# Patient Record
Sex: Female | Born: 1985 | Race: White | Hispanic: No | Marital: Single | Smoking: Never smoker
Health system: Southern US, Community
[De-identification: ages and names within clinical notes are randomized; demographics above are authoritative.]

## PROBLEM LIST (undated history)

## (undated) DIAGNOSIS — Q631 Lobulated, fused and horseshoe kidney: Secondary | ICD-10-CM

## (undated) DIAGNOSIS — E559 Vitamin D deficiency, unspecified: Secondary | ICD-10-CM

## (undated) DIAGNOSIS — Q632 Ectopic kidney: Secondary | ICD-10-CM

## (undated) DIAGNOSIS — F419 Anxiety disorder, unspecified: Secondary | ICD-10-CM

---

## 2016-01-07 ENCOUNTER — Encounter (HOSPITAL_COMMUNITY): Payer: Self-pay

## 2016-01-07 ENCOUNTER — Emergency Department (HOSPITAL_COMMUNITY)
Admission: EM | Admit: 2016-01-07 | Discharge: 2016-01-07 | Disposition: A | Discharge status: Home / Self Care | Payer: Medicaid Other | Attending: Emergency Medicine | Admitting: Emergency Medicine

## 2016-01-07 ENCOUNTER — Emergency Department (HOSPITAL_COMMUNITY): Payer: Medicaid Other

## 2016-01-07 DIAGNOSIS — M62838 Other muscle spasm: Secondary | ICD-10-CM

## 2016-01-07 DIAGNOSIS — Z791 Long term (current) use of non-steroidal anti-inflammatories (NSAID): Secondary | ICD-10-CM | POA: Diagnosis not present

## 2016-01-07 DIAGNOSIS — M542 Cervicalgia: Secondary | ICD-10-CM

## 2016-01-07 MED ORDER — KETOROLAC TROMETHAMINE 60 MG/2ML IM SOLN
60.0000 mg | Freq: Once | INTRAMUSCULAR | Status: AC
  Administered 2016-01-07: 60 mg via INTRAMUSCULAR
  Filled 2016-01-07: qty 2

## 2016-01-07 MED ORDER — CYCLOBENZAPRINE HCL 10 MG PO TABS: 10.0000 mg | ORAL_TABLET | Freq: Three times a day (TID) | ORAL | Status: AC | PRN

## 2016-01-07 MED ORDER — DIAZEPAM 5 MG PO TABS
5.0000 mg | ORAL_TABLET | Freq: Once | ORAL | Status: AC
  Administered 2016-01-07: 5 mg via ORAL
  Filled 2016-01-07: qty 1

## 2016-01-07 MED ORDER — HYDROCODONE-ACETAMINOPHEN 5-325 MG PO TABS: 1.0000 | ORAL_TABLET | Freq: Four times a day (QID) | ORAL | Status: AC | PRN

## 2016-01-07 MED ORDER — IBUPROFEN 600 MG PO TABS: 600.0000 mg | ORAL_TABLET | Freq: Three times a day (TID) | ORAL | Status: AC | PRN

## 2016-01-07 MED ORDER — OXYCODONE-ACETAMINOPHEN 5-325 MG PO TABS
1.0000 | ORAL_TABLET | Freq: Once | ORAL | Status: AC
  Administered 2016-01-07: 1 via ORAL
  Filled 2016-01-07: qty 1

## 2016-01-07 NOTE — ED Notes (Signed)
MD at bedside. 

## 2016-01-07 NOTE — ED Notes (Signed)
Called for a ride

## 2016-01-07 NOTE — ED Notes (Signed)
Patient states she turned her head this AM and noted that she had posterior neck pain then she became dizzy and felt hot.

## 2016-01-07 NOTE — ED Notes (Signed)
Pt independent and ambulatory at discharge.  

## 2016-01-07 NOTE — Discharge Instructions (Signed)
Cervical Sprain  A cervical sprain is an injury in the neck in which the strong, fibrous tissues (ligaments) that connect your neck bones stretch or tear. Cervical sprains can range from mild to severe. Severe cervical sprains can cause the neck vertebrae to be unstable. This can lead to damage of the spinal cord and can result in serious nervous system problems. The amount of time it takes for a cervical sprain to get better depends on the cause and extent of the injury. Most cervical sprains heal in 1 to 3 weeks.  CAUSES   Severe cervical sprains may be caused by:    Contact sport injuries (such as from football, rugby, wrestling, hockey, auto racing, gymnastics, diving, martial arts, or boxing).    Motor vehicle collisions.    Whiplash injuries. This is an injury from a sudden forward and backward whipping movement of the head and neck.   Falls.   Mild cervical sprains may be caused by:    Being in an awkward position, such as while cradling a telephone between your ear and shoulder.    Sitting in a chair that does not offer proper support.    Working at a poorly designed computer station.    Looking up or down for long periods of time.   SYMPTOMS    Pain, soreness, stiffness, or a burning sensation in the front, back, or sides of the neck. This discomfort may develop immediately after the injury or slowly, 24 hours or more after the injury.    Pain or tenderness directly in the middle of the back of the neck.    Shoulder or upper back pain.    Limited ability to move the neck.    Headache.    Dizziness.    Weakness, numbness, or tingling in the hands or arms.    Muscle spasms.    Difficulty swallowing or chewing.    Tenderness and swelling of the neck.   DIAGNOSIS   Most of the time your health care provider can diagnose a cervical sprain by taking your history and doing a physical exam. Your health care provider will ask about previous neck injuries and any known neck  problems, such as arthritis in the neck. X-rays may be taken to find out if there are any other problems, such as with the bones of the neck. Other tests, such as a CT scan or MRI, may also be needed.   TREATMENT   Treatment depends on the severity of the cervical sprain. Mild sprains can be treated with rest, keeping the neck in place (immobilization), and pain medicines. Severe cervical sprains are immediately immobilized. Further treatment is done to help with pain, muscle spasms, and other symptoms and may include:   Medicines, such as pain relievers, numbing medicines, or muscle relaxants.    Physical therapy. This may involve stretching exercises, strengthening exercises, and posture training. Exercises and improved posture can help stabilize the neck, strengthen muscles, and help stop symptoms from returning.   HOME CARE INSTRUCTIONS    Put ice on the injured area.     Put ice in a plastic bag.     Place a towel between your skin and the bag.     Leave the ice on for 15-20 minutes, 3-4 times a day.    If your injury was severe, you may have been given a cervical collar to wear. A cervical collar is a two-piece collar designed to keep your neck from moving while it heals.      Do not remove the collar unless instructed by your health care provider.    If you have long hair, keep it outside of the collar.    Ask your health care provider before making any adjustments to your collar. Minor adjustments may be required over time to improve comfort and reduce pressure on your chin or on the back of your head.    Ifyou are allowed to remove the collar for cleaning or bathing, follow your health care provider's instructions on how to do so safely.    Keep your collar clean by wiping it with mild soap and water and drying it completely. If the collar you have been given includes removable pads, remove them every 1-2 days and hand wash them with soap and water. Allow them to air dry. They should be completely  dry before you wear them in the collar.    If you are allowed to remove the collar for cleaning and bathing, wash and dry the skin of your neck. Check your skin for irritation or sores. If you see any, tell your health care provider.    Do not drive while wearing the collar.    Only take over-the-counter or prescription medicines for pain, discomfort, or fever as directed by your health care provider.    Keep all follow-up appointments as directed by your health care provider.    Keep all physical therapy appointments as directed by your health care provider.    Make any needed adjustments to your workstation to promote good posture.    Avoid positions and activities that make your symptoms worse.    Warm up and stretch before being active to help prevent problems.   SEEK MEDICAL CARE IF:    Your pain is not controlled with medicine.    You are unable to decrease your pain medicine over time as planned.    Your activity level is not improving as expected.   SEEK IMMEDIATE MEDICAL CARE IF:    You develop any bleeding.   You develop stomach upset.   You have signs of an allergic reaction to your medicine.    Your symptoms get worse.    You develop new, unexplained symptoms.    You have numbness, tingling, weakness, or paralysis in any part of your body.   MAKE SURE YOU:    Understand these instructions.   Will watch your condition.   Will get help right away if you are not doing well or get worse.     This information is not intended to replace advice given to you by your health care provider. Make sure you discuss any questions you have with your health care provider.     Document Released: 05/30/2007 Document Revised: 08/07/2013 Document Reviewed: 02/07/2013  [employer]vier Interactive Patient Education 2016 [employer]

## 2016-01-07 NOTE — ED Notes (Addendum)
Pt states at 0730 turned head to left and felt sharp pain to rt arm with limited rt arm movement lasting only til 0830. Pt states she felt dizzy at moment however at present symptoms are better. Pt neuro intact. Pt denies slurred speech. Pt c/o of posterior pain to neck and shoulders bil

## 2016-01-07 NOTE — ED Provider Notes (Signed)
CSN: [id number]     Arrival date & time 01/07/16  0807 History   First MD Initiated Contact with Patient 01/07/16 [phone]     Chief Complaint  Patient presents with  . Neck Pain      HPI Patient presents to the emergency department with complaints of severe neck pain after leaning her head back this morning.  She was prepared to go to work.  She reports pain with movement of her neck at this time.  She denies weakness of her upper lower extremities.  She had transient dizziness which is since resolved.   History reviewed. No pertinent past medical history. History reviewed. No pertinent past surgical history. Family History  Problem Relation Age of Onset  . Heart failure Mother   . Diabetes Mother   . Cancer Father    Social History  Substance Use Topics  . Smoking status: Never Smoker   . Smokeless tobacco: Never Used  . Alcohol Use: No   OB History    No data available     Review of Systems  All other systems reviewed and are negative.     Allergies  Review of patient's allergies indicates no known allergies.  Home Medications   Prior to Admission medications   Medication Sig Start Date End Date Taking? Authorizing Provider  Biotin 10 MG CAPS Take 1 capsule by mouth daily.   Yes Historical Provider, MD  cholecalciferol (VITAMIN D) 400 units TABS tablet Take 400 Units by mouth daily.   Yes Historical Provider, MD  diphenhydramine-acetaminophen (TYLENOL PM) 25-500 MG TABS tablet Take 1 tablet by mouth at bedtime as needed.   Yes Historical Provider, MD  ferrous sulfate 325 (65 FE) MG tablet Take 325 mg by mouth daily with breakfast.   Yes Historical Provider, MD  medroxyPROGESTERone (DEPO-PROVERA) 150 MG/ML injection Inject 150 mg into the muscle every 3 (three) months. Last shot January. Due for another shot   Yes Historical Provider, MD  Melatonin 1 MG TABS Take 2 tablets by mouth at bedtime.   Yes Historical Provider, MD  vitamin B-12 (CYANOCOBALAMIN) 100 MCG tablet  Take 100 mcg by mouth daily.   Yes Historical Provider, MD  cyclobenzaprine (FLEXERIL) 10 MG tablet Take 1 tablet (10 mg total) by mouth 3 (three) times daily as needed for muscle spasms. 01/07/16   Azalia Bilis, MD  HYDROcodone-acetaminophen (NORCO/VICODIN) 5-325 MG tablet Take 1 tablet by mouth every 6 (six) hours as needed for moderate pain. 01/07/16   Azalia Bilis, MD  ibuprofen (ADVIL,MOTRIN) 600 MG tablet Take 1 tablet (600 mg total) by mouth every 8 (eight) hours as needed. 01/07/16   Azalia Bilis, MD   BP 116/79 mmHg  Pulse 86  Temp(Src) 98.2 F (36.8 C) (Oral)  Resp 18  Ht  (1.575 m)  Wt 130 lb (58.968 kg)  BMI 23.77 kg/m2  SpO2 100%  LMP 01/07/2016 Physical Exam  Constitutional: She is oriented to person, place, and time. She appears well-developed and well-nourished.  HENT:  Head: Normocephalic.  Eyes: EOM are normal.  Neck: Neck supple.  Mild cervical and paracervical tenderness without cervical step-off.  Pulmonary/Chest: Effort normal.  Abdominal: She exhibits no distension.  Musculoskeletal: Normal range of motion.  Neurological: She is alert and oriented to person, place, and time.  5 out of 5 strength in bilateral upper and lower extremity major muscle groups.  Psychiatric: She has a normal mood and affect.  Nursing note and vitals reviewed.   ED Course  Procedures (  including critical care time) Labs Review Labs Reviewed - No data to display  Imaging Review Dg Cervical Spine Complete  01/07/2016  CLINICAL DATA:  Neck pain post bending this morning, right posterior neck stiffness, sharp pain posterior neck EXAM: CERVICAL SPINE - COMPLETE 4+ VIEW COMPARISON:  None. FINDINGS: Six views of the cervical spine submitted. No acute fracture or subluxation. Alignment and vertebral body heights are preserved. Minimal disc space flattening at C5-C6 level. No prevertebral soft tissue swelling. Cervical airway is patent. C1-C2 relationship is unremarkable. No neural  foramina narrowing noted on oblique views. IMPRESSION: No acute fracture or subluxation. Alignment and vertebral body heights are preserved. Minimal disc space flattening at C5-C6 level. Electronically Signed   By: Natasha MeadLiviu  Pop M.D.   On: 01/07/2016 10:21   I have personally reviewed and evaluated these images and lab results as part of my medical decision-making.   EKG Interpretation None      MDM   Final diagnoses:  Neck pain  Neck muscle spasm    11:52 AM Patient feels much better after management of her pain in the emergency department.  Home with short course of anti-inflammatories and muscle relaxants.  Primary care follow-up.  She understands return to the ER for new or worsening symptoms.  Suspect cervical strain/spasm.  Doubt vertebral artery injury    Azalia BilisKevin Atha Mcbain, MD 01/07/16 1152

## 2016-02-14 ENCOUNTER — Encounter (HOSPITAL_COMMUNITY): Payer: Self-pay | Admitting: Emergency Medicine

## 2016-02-14 ENCOUNTER — Emergency Department (HOSPITAL_COMMUNITY): Payer: Medicaid Other

## 2016-02-14 ENCOUNTER — Emergency Department (HOSPITAL_COMMUNITY)
Admission: EM | Admit: 2016-02-14 | Discharge: 2016-02-15 | Disposition: A | Discharge status: Home / Self Care | Payer: Medicaid Other | Attending: Emergency Medicine | Admitting: Emergency Medicine

## 2016-02-14 DIAGNOSIS — Z79899 Other long term (current) drug therapy: Secondary | ICD-10-CM | POA: Diagnosis not present

## 2016-02-14 DIAGNOSIS — R51 Headache: Secondary | ICD-10-CM | POA: Diagnosis not present

## 2016-02-14 DIAGNOSIS — I7774 Dissection of vertebral artery: Secondary | ICD-10-CM

## 2016-02-14 DIAGNOSIS — R200 Anesthesia of skin: Secondary | ICD-10-CM | POA: Diagnosis not present

## 2016-02-14 DIAGNOSIS — M542 Cervicalgia: Secondary | ICD-10-CM | POA: Insufficient documentation

## 2016-02-14 LAB — BASIC METABOLIC PANEL
Anion gap: 7 (ref 5–15)
BUN: 21 mg/dL — ABNORMAL HIGH (ref 6–20)
CO2: 25 mmol/L (ref 22–32)
Calcium: 9.4 mg/dL (ref 8.9–10.3)
Chloride: 106 mmol/L (ref 101–111)
Creatinine, Ser: 0.67 mg/dL (ref 0.44–1.00)
GFR calc Af Amer: >60 mL/min (ref >60)
GFR calc non Af Amer: >60 mL/min (ref >60)
Glucose, Bld: 86 mg/dL (ref 65–99)
Potassium: 3.6 mmol/L (ref 3.5–5.1)
Sodium: 138 mmol/L (ref 135–145)

## 2016-02-14 LAB — CBC WITH DIFFERENTIAL/PLATELET
Basophils Absolute: 0 10*3/uL (ref 0.0–0.1)
Basophils Relative: 0 %
Eosinophils Absolute: 0.1 10*3/uL (ref 0.0–0.7)
Eosinophils Relative: 1 %
HCT: 38.6 % (ref 36.0–46.0)
Hemoglobin: 13.3 g/dL (ref 12.0–15.0)
Lymphocytes Relative: 30 %
Lymphs Abs: 2.8 10*3/uL (ref 0.7–4.0)
MCH: 30.6 pg (ref 26.0–34.0)
MCHC: 34.5 g/dL (ref 30.0–36.0)
MCV: 88.9 fL (ref 78.0–100.0)
Monocytes Absolute: 0.6 10*3/uL (ref 0.1–1.0)
Monocytes Relative: 6 %
Neutro Abs: 5.9 10*3/uL (ref 1.7–7.7)
Neutrophils Relative %: 63 %
Platelets: 189 10*3/uL (ref 150–400)
RBC: 4.34 MIL/uL (ref 3.87–5.11)
RDW: 12.4 % (ref 11.5–15.5)
WBC: 9.4 10*3/uL (ref 4.0–10.5)

## 2016-02-14 LAB — PREGNANCY, URINE: Preg Test, Ur: NEGATIVE

## 2016-02-14 MED ORDER — MORPHINE SULFATE (PF) 4 MG/ML IV SOLN
4.0000 mg | Freq: Once | INTRAVENOUS | Status: AC
  Administered 2016-02-14: 4 mg via INTRAVENOUS
  Filled 2016-02-14: qty 1

## 2016-02-14 NOTE — ED Notes (Signed)
Pt has had neck pain since May with no injury of origin. Pt states she hurts worse when she moves her head back or forwards. Pt has been seen by her PCP and is now seeing a chiropractor. Pt states that her PCP just gives her pain medication and muscle relaxers, but does not give her answers as to why she is hurting. Pt states that the pain has not increased or changed, she is just "tired of it and wants answers".

## 2016-02-14 NOTE — ED Provider Notes (Signed)
CSN: [id number]     Arrival date & time 02/14/16  1907 History  By signing my name below, I, Linna DarnerRussell Turner, attest that this documentation has been prepared under the direction and in the presence of non-physician practitioner, Buel ReamAlexandra Alesha Jaffee, PA-C. Electronically Signed: Linna Darnerussell Turner, Scribe. 02/14/2016. 7:43 PM.   Chief Complaint  Patient presents with  . Neck Pain    The history is provided by the patient. No language interpreter was used.     HPI Comments: Kerry Reid is a 30 y.o. female who presents to the Emergency Department complaining of constant, waxing and waning, sharp, unchanged, left-sided posterior neck pain onset one month. She was seen here when her pain first presented. Pt reports that her pain initially presented when she was doing her hair, lifted an arm, and felt like she tweaked her neck. Pt states that her pain is worse with certain movements and positions; she states that cannot fully rotate her neck to the left due to pain and notes that her most severe neck pain presents when she tries to get up after laying down. She denies pain exacerbation with palpation to her neck. She notes that she has experienced occasional, burning numbness in her neck "a few times" that lasts for a few seconds. She notes new fatigue, weakness, and intermittent generalized headaches over the last several days; she states that she has a frontal headache currently. Pt has seen her PCP and a chiropractor for her neck pain and has been prescribed hydrocodone and muscle relaxants; she states that nothing has helped and she is not using the muscle relaxants anymore. Patient did not see a chiropractor regularly before this neck pain began. She denies known injury to her neck. 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 She denies neuro deficits, neck stiffness, vision changes, fevers, or any other associated symptoms.  History reviewed. No pertinent past medical  history. History reviewed. No pertinent past surgical history. Family History  Problem Relation Age of Onset  . Heart failure Mother   . Diabetes Mother   . Cancer Father    Social History  Substance Use Topics  . Smoking status: Never Smoker   . Smokeless tobacco: Never Used  . Alcohol Use: No   OB History    No data available     Review of Systems  Constitutional: Positive for fatigue. Negative for fever and chills.  HENT: Negative for facial swelling and sore throat.   Respiratory: Negative for shortness of breath.   Cardiovascular: Negative for chest pain.  Gastrointestinal: Negative for nausea, vomiting and abdominal pain.  Genitourinary: Negative for dysuria.  Musculoskeletal: Positive for neck pain (left-sided posterior) and neck stiffness. Negative for back pain.  Skin: Negative for rash and wound.  Neurological: Positive for numbness (intermittent to L neck) and headaches.  Psychiatric/Behavioral: The patient is not nervous/anxious.     Allergies  Review of patient's allergies indicates no known allergies.  Home Medications   Prior to Admission medications   Medication Sig Start Date End Date Taking? Authorizing Provider  Biotin 10 MG CAPS Take 1 capsule by mouth daily.   Yes Historical Provider, MD  cholecalciferol (VITAMIN D) 400 units TABS tablet Take 400 Units by mouth daily.   Yes Historical Provider, MD  ferrous sulfate 325 (65 FE) MG tablet Take 325 mg by mouth daily with breakfast.   Yes Historical Provider, MD  medroxyPROGESTERone (DEPO-PROVERA) 150 MG/ML injection Inject 150 mg into the muscle every  3 (three) months. Last shot January. Due for another shot   Yes Historical Provider, MD  Melatonin 1 MG TABS Take 5 mg by mouth at bedtime.    Yes Historical Provider, MD  vitamin B-12 (CYANOCOBALAMIN) 100 MCG tablet Take 100 mcg by mouth daily.   Yes Historical Provider, MD  cyclobenzaprine (FLEXERIL) 10 MG tablet Take 1 tablet (10 mg total) by mouth 3  (three) times daily as needed for muscle spasms. Patient not taking: Reported on 02/14/2016 01/07/16   Azalia Bilis, MD  HYDROcodone-acetaminophen (NORCO/VICODIN) 5-325 MG tablet Take 1 tablet by mouth every 6 (six) hours as needed for moderate pain. Patient not taking: Reported on 02/14/2016 01/07/16   Azalia Bilis, MD  ibuprofen (ADVIL,MOTRIN) 600 MG tablet Take 1 tablet (600 mg total) by mouth every 8 (eight) hours as needed. Patient not taking: Reported on 02/14/2016 01/07/16   Azalia Bilis, MD   BP 128/77 mmHg  Pulse 76  Temp(Src) 98.7 F (37.1 C) (Oral)  Resp 16  Ht  (1.575 m)  Wt 58.968 kg  BMI 23.77 kg/m2  SpO2 100% Physical Exam  Constitutional: She appears well-developed and well-nourished. No distress.  HENT:  Head: Normocephalic and atraumatic.  Mouth/Throat: Oropharynx is clear and moist. No oropharyngeal exudate.  Eyes: Conjunctivae and EOM are normal. Pupils are equal, round, and reactive to light. Right eye exhibits no discharge. Left eye exhibits no discharge. No scleral icterus.  Neck: Neck supple. No thyromegaly present.  No tenderness on palpation of lateral neck musculature or cervical spine, no masses palpated; patient has limited range of motion due to pain  Cardiovascular: Normal rate, regular rhythm, normal heart sounds and intact distal pulses.  Exam reveals no gallop and no friction rub.   No murmur heard. Pulmonary/Chest: Effort normal and breath sounds normal. No stridor. No respiratory distress. She has no wheezes. She has no rales.  Abdominal: Soft. Bowel sounds are normal. She exhibits no distension. There is no tenderness. There is no rebound and no guarding.  Musculoskeletal: She exhibits no edema.  Lymphadenopathy:    She has no cervical adenopathy.  Neurological: She is alert. Coordination normal.  CN 3-12 intact; normal sensation throughout; 5/5 strength in all 4 extremities; equal bilateral grip strength  Skin: Skin is warm and dry. No rash noted.  She is not diaphoretic. No pallor.  Psychiatric: She has a normal mood and affect.  Nursing note and vitals reviewed.   ED Course  Procedures (including critical care time)  DIAGNOSTIC STUDIES: Oxygen Saturation is 100% on RA, normal by my interpretation.    COORDINATION OF CARE: 7:43 PM Discussed treatment plan with pt at bedside and pt agreed to plan.  Labs Review Labs Reviewed  BASIC METABOLIC PANEL - Abnormal; Notable for the following:    BUN 21 (*)    All other components within normal limits  CBC WITH DIFFERENTIAL/PLATELET  PREGNANCY, URINE    Imaging Review No results found. I have personally reviewed and evaluated these images and lab results as part of my medical decision-making.   EKG Interpretation None      MDM   CBC, BMP unremarkable. Urine pregnancy negative. Concern for vertebral artery dissection, concern for MS or other neurological etiology. MRI, MRA pending. Patient also evaluated by Dr. Verdie Mosher who is in agreement with plan. At shift change, patient care transferred to [facility], PA-C for continued evaluation, follow up of MRI/ MRA and determination of disposition.     Final diagnoses:  Neck pain  I personally performed the services described in this documentation, which was scribed in my presence. The recorded information has been reviewed and is accurate.   Emi Holeslexandra M Fadumo Heng, PA-C 02/14/16 2226  Lavera Guiseana Duo Liu, MD 02/14/16 2240

## 2016-02-15 MED ORDER — GADOBENATE DIMEGLUMINE 529 MG/ML IV SOLN
13.0000 mL | Freq: Once | INTRAVENOUS | Status: AC | PRN
  Administered 2016-02-15: 13 mL via INTRAVENOUS

## 2016-02-15 NOTE — ED Notes (Signed)
Pt returned from MRI °

## 2016-02-15 NOTE — Discharge Instructions (Signed)
Cervical Sprain  A cervical sprain is an injury in the neck in which the strong, fibrous tissues (ligaments) that connect your neck bones stretch or tear. Cervical sprains can range from mild to severe. Severe cervical sprains can cause the neck vertebrae to be unstable. This can lead to damage of the spinal cord and can result in serious nervous system problems. The amount of time it takes for a cervical sprain to get better depends on the cause and extent of the injury. Most cervical sprains heal in 1 to 3 weeks.  CAUSES   Severe cervical sprains may be caused by:    Contact sport injuries (such as from football, rugby, wrestling, hockey, auto racing, gymnastics, diving, martial arts, or boxing).    Motor vehicle collisions.    Whiplash injuries. This is an injury from a sudden forward and backward whipping movement of the head and neck.   Falls.   Mild cervical sprains may be caused by:    Being in an awkward position, such as while cradling a telephone between your ear and shoulder.    Sitting in a chair that does not offer proper support.    Working at a poorly designed computer station.    Looking up or down for long periods of time.   SYMPTOMS    Pain, soreness, stiffness, or a burning sensation in the front, back, or sides of the neck. This discomfort may develop immediately after the injury or slowly, 24 hours or more after the injury.    Pain or tenderness directly in the middle of the back of the neck.    Shoulder or upper back pain.    Limited ability to move the neck.    Headache.    Dizziness.    Weakness, numbness, or tingling in the hands or arms.    Muscle spasms.    Difficulty swallowing or chewing.    Tenderness and swelling of the neck.   DIAGNOSIS   Most of the time your health care provider can diagnose a cervical sprain by taking your history and doing a physical exam. Your health care provider will ask about previous neck injuries and any known neck  problems, such as arthritis in the neck. X-rays may be taken to find out if there are any other problems, such as with the bones of the neck. Other tests, such as a CT scan or MRI, may also be needed.   TREATMENT   Treatment depends on the severity of the cervical sprain. Mild sprains can be treated with rest, keeping the neck in place (immobilization), and pain medicines. Severe cervical sprains are immediately immobilized. Further treatment is done to help with pain, muscle spasms, and other symptoms and may include:   Medicines, such as pain relievers, numbing medicines, or muscle relaxants.    Physical therapy. This may involve stretching exercises, strengthening exercises, and posture training. Exercises and improved posture can help stabilize the neck, strengthen muscles, and help stop symptoms from returning.   HOME CARE INSTRUCTIONS    Put ice on the injured area.     Put ice in a plastic bag.     Place a towel between your skin and the bag.     Leave the ice on for 15-20 minutes, 3-4 times a day.    If your injury was severe, you may have been given a cervical collar to wear. A cervical collar is a two-piece collar designed to keep your neck from moving while it heals.      Do not remove the collar unless instructed by your health care provider.    If you have long hair, keep it outside of the collar.    Ask your health care provider before making any adjustments to your collar. Minor adjustments may be required over time to improve comfort and reduce pressure on your chin or on the back of your head.    Ifyou are allowed to remove the collar for cleaning or bathing, follow your health care provider's instructions on how to do so safely.    Keep your collar clean by wiping it with mild soap and water and drying it completely. If the collar you have been given includes removable pads, remove them every 1-2 days and hand wash them with soap and water. Allow them to air dry. They should be completely  dry before you wear them in the collar.    If you are allowed to remove the collar for cleaning and bathing, wash and dry the skin of your neck. Check your skin for irritation or sores. If you see any, tell your health care provider.    Do not drive while wearing the collar.    Only take over-the-counter or prescription medicines for pain, discomfort, or fever as directed by your health care provider.    Keep all follow-up appointments as directed by your health care provider.    Keep all physical therapy appointments as directed by your health care provider.    Make any needed adjustments to your workstation to promote good posture.    Avoid positions and activities that make your symptoms worse.    Warm up and stretch before being active to help prevent problems.   SEEK MEDICAL CARE IF:    Your pain is not controlled with medicine.    You are unable to decrease your pain medicine over time as planned.    Your activity level is not improving as expected.   SEEK IMMEDIATE MEDICAL CARE IF:    You develop any bleeding.   You develop stomach upset.   You have signs of an allergic reaction to your medicine.    Your symptoms get worse.    You develop new, unexplained symptoms.    You have numbness, tingling, weakness, or paralysis in any part of your body.   MAKE SURE YOU:    Understand these instructions.   Will watch your condition.   Will get help right away if you are not doing well or get worse.     This information is not intended to replace advice given to you by your health care provider. Make sure you discuss any questions you have with your health care provider.     Document Released: 05/30/2007 Document Revised: 08/07/2013 Document Reviewed: 02/07/2013  [employer]vier Interactive Patient Education 2016 [employer]

## 2016-02-15 NOTE — ED Provider Notes (Signed)
1:41 AM Patient care assumed from Kerry BayleyAlex Law, PA-C. MRA pending at shift change. Imaging has been reviewed and is negative for acute findings. These have been reviewed with the patient who verbalizes understanding. Discussed high suspicion for MSK etiology +/- peripheral radiculopathy. Will d/c with orthopedic referral. Return precautions given at discharge.   Results for orders placed or performed during the hospital encounter of 02/14/16  Basic metabolic panel  Result Value Ref Range   Sodium 138 135 - 145 mmol/L   Potassium 3.6 3.5 - 5.1 mmol/L   Chloride 106 101 - 111 mmol/L   CO2 25 22 - 32 mmol/L   Glucose, Bld 86 65 - 99 mg/dL   BUN 21 (H) 6 - 20 mg/dL   Creatinine, Ser 9.600.67 0.44 - 1.00 mg/dL   Calcium 9.4 8.9 - 45.410.3 mg/dL   GFR calc non Af Amer >60 >60 mL/min   GFR calc Af Amer >60 >60 mL/min   Anion gap 7 5 - 15  CBC with Differential  Result Value Ref Range   WBC 9.4 4.0 - 10.5 K/uL   RBC 4.34 3.87 - 5.11 MIL/uL   Hemoglobin 13.3 12.0 - 15.0 g/dL   HCT 09.838.6 11.936.0 - 14.746.0 %   MCV 88.9 78.0 - 100.0 fL   MCH 30.6 26.0 - 34.0 pg   MCHC 34.5 30.0 - 36.0 g/dL   RDW 82.912.4 56.211.5 - 13.015.5 %   Platelets 189 150 - 400 K/uL   Neutrophils Relative % 63 %   Neutro Abs 5.9 1.7 - 7.7 K/uL   Lymphocytes Relative 30 %   Lymphs Abs 2.8 0.7 - 4.0 K/uL   Monocytes Relative 6 %   Monocytes Absolute 0.6 0.1 - 1.0 K/uL   Eosinophils Relative 1 %   Eosinophils Absolute 0.1 0.0 - 0.7 K/uL   Basophils Relative 0 %   Basophils Absolute 0.0 0.0 - 0.1 K/uL  Pregnancy, urine  Result Value Ref Range   Preg Test, Ur NEGATIVE NEGATIVE   Mr Kerry GlennMra Head Wo Contrast  02/15/2016  CLINICAL DATA:  Initial evaluation for 1 month history of left-sided neck pain. EXAM: MRA NECK WITHOUT AND WITH CONTRAST MRA HEAD WITHOUT CONTRAST TECHNIQUE: Multiplanar and multiecho pulse sequences of the neck were obtained without and with intravenous contrast. Angiographic images of the neck were obtained using MRA technique without  and with intravenous contast.; Angiographic images of the Circle of Willis were obtained using MRA technique without intravenous contrast. CONTRAST:  13mL MULTIHANCE GADOBENATE DIMEGLUMINE 529 MG/ML IV SOLN COMPARISON:  None. FINDINGS: MRA NECK FINDINGS Visualized aortic arch of normal caliber with normal branch pattern. No high-grade stenosis seen at the origin of the great vessels. Visualized subclavian arteries are widely patent. Right common carotid artery patent from its origin to the bifurcation. No significant atheromatous plaque about the right bifurcation. Right ICA patent from the bifurcation to the circle of Willis without evidence for stenosis, dissection, or occlusion. Left common carotid artery patent from its origin to the bifurcation. No significant atheromatous plaque about the left bifurcation. Left ICA patent from the bifurcation to the circle of Willis without evidence for stenosis, dissection, or occlusion. Both of the vertebral arteries arise from the subclavian arteries. Vertebral arteries widely patent without evidence for stenosis, occlusion, or dissection. Visualized soft tissues of the neck grossly unremarkable without acute abnormality. MRA HEAD FINDINGS ANTERIOR CIRCULATION: Visualized distal cervical segments of the internal carotid arteries are patent with antegrade flow. Petrous, cavernous, and supraclinoid segments are widely patent.  A1 segments, anterior communicating artery common anterior cerebral arteries well opacified. M1 segments patent without stenosis or occlusion. MCA bifurcations normal. Distal MCA branches well opacified and symmetric. POSTERIOR CIRCULATION: Vertebral arteries patent to the vertebrobasilar junction. Left posterior inferior cerebral artery patent. Right posterior inferior cerebral artery not well visualized on this exam. Basilar artery widely patent. Superior cerebral arteries patent bilaterally. Both of the posterior cerebral arteries arise from the  basilar artery are well opacified to their distal aspects. Possible mild atheromatous regularity within the mid left P2 segment. Small right posterior communicating artery present. No aneurysm or vascular malformation. IMPRESSION: Normal MRA of the head and neck. Electronically Signed   By: Rise MuBenjamin  McClintock M.D.   On: 02/15/2016 01:29   Mr Angiogram Neck W Wo Contrast  02/15/2016  CLINICAL DATA:  Initial evaluation for 1 month history of left-sided neck pain. EXAM: MRA NECK WITHOUT AND WITH CONTRAST MRA HEAD WITHOUT CONTRAST TECHNIQUE: Multiplanar and multiecho pulse sequences of the neck were obtained without and with intravenous contrast. Angiographic images of the neck were obtained using MRA technique without and with intravenous contast.; Angiographic images of the Circle of Willis were obtained using MRA technique without intravenous contrast. CONTRAST:  13mL MULTIHANCE GADOBENATE DIMEGLUMINE 529 MG/ML IV SOLN COMPARISON:  None. FINDINGS: MRA NECK FINDINGS Visualized aortic arch of normal caliber with normal branch pattern. No high-grade stenosis seen at the origin of the great vessels. Visualized subclavian arteries are widely patent. Right common carotid artery patent from its origin to the bifurcation. No significant atheromatous plaque about the right bifurcation. Right ICA patent from the bifurcation to the circle of Willis without evidence for stenosis, dissection, or occlusion. Left common carotid artery patent from its origin to the bifurcation. No significant atheromatous plaque about the left bifurcation. Left ICA patent from the bifurcation to the circle of Willis without evidence for stenosis, dissection, or occlusion. Both of the vertebral arteries arise from the subclavian arteries. Vertebral arteries widely patent without evidence for stenosis, occlusion, or dissection. Visualized soft tissues of the neck grossly unremarkable without acute abnormality. MRA HEAD FINDINGS ANTERIOR  CIRCULATION: Visualized distal cervical segments of the internal carotid arteries are patent with antegrade flow. Petrous, cavernous, and supraclinoid segments are widely patent. A1 segments, anterior communicating artery common anterior cerebral arteries well opacified. M1 segments patent without stenosis or occlusion. MCA bifurcations normal. Distal MCA branches well opacified and symmetric. POSTERIOR CIRCULATION: Vertebral arteries patent to the vertebrobasilar junction. Left posterior inferior cerebral artery patent. Right posterior inferior cerebral artery not well visualized on this exam. Basilar artery widely patent. Superior cerebral arteries patent bilaterally. Both of the posterior cerebral arteries arise from the basilar artery are well opacified to their distal aspects. Possible mild atheromatous regularity within the mid left P2 segment. Small right posterior communicating artery present. No aneurysm or vascular malformation. IMPRESSION: Normal MRA of the head and neck. Electronically Signed   By: Rise MuBenjamin  McClintock M.D.   On: 02/15/2016 01:29      Antony MaduraKelly Vee Bahe, PA-C 02/15/16 0142  Antony MaduraKelly Delyla Sandeen, PA-C 02/15/16 [id number]  Lavera Guiseana Duo Liu, MD 02/15/16 1201

## 2016-02-15 NOTE — ED Notes (Signed)
Pt transported to MRI 

## 2016-05-31 ENCOUNTER — Encounter (HOSPITAL_COMMUNITY): Payer: Self-pay | Admitting: Emergency Medicine

## 2016-05-31 ENCOUNTER — Emergency Department (HOSPITAL_COMMUNITY)
Admission: EM | Admit: 2016-05-31 | Discharge: 2016-05-31 | Disposition: A | Discharge status: Home / Self Care | Payer: Medicaid Other | Attending: Dermatology | Admitting: Dermatology

## 2016-05-31 DIAGNOSIS — N390 Urinary tract infection, site not specified: Secondary | ICD-10-CM | POA: Insufficient documentation

## 2016-05-31 DIAGNOSIS — Z5321 Procedure and treatment not carried out due to patient leaving prior to being seen by health care provider: Secondary | ICD-10-CM | POA: Diagnosis not present

## 2016-05-31 HISTORY — DX: Ectopic kidney: Q63.2

## 2016-05-31 HISTORY — DX: Anxiety disorder, unspecified: F41.9

## 2016-05-31 HISTORY — DX: Vitamin D deficiency, unspecified: E55.9

## 2016-05-31 NOTE — ED Triage Notes (Signed)
Pt states she has had R sided flank pain with dysuria x 1 day but has recurrent UTIs. Alert and oriented.

## 2016-11-16 IMAGING — MR MR MRA HEAD W/O CM
2 series · 15 of 48 positions shown · IV contrast (multihance)
Comparison: None.

CLINICAL DATA: Initial evaluation for 1 month history of left-sided
neck pain.

EXAM:
MRA NECK WITHOUT AND WITH CONTRAST
MRA HEAD WITHOUT CONTRAST
TECHNIQUE: Multiplanar and multiecho pulse sequences of the neck were obtained
without and with intravenous contrast. Angiographic images of the
neck were obtained using MRA technique without and with intravenous
contast.; Angiographic images of the Circle of Willis were obtained
using MRA technique without intravenous contrast.
CONTRAST:  13mL MULTIHANCE GADOBENATE DIMEGLUMINE 529 MG/ML IV SOLN

[Series 3: (id) mt fs · axial · 1.4mm · 0.39mm/px · z∈[-62,+81]mm · 14 of 215 slices shown]
[im 1/215]
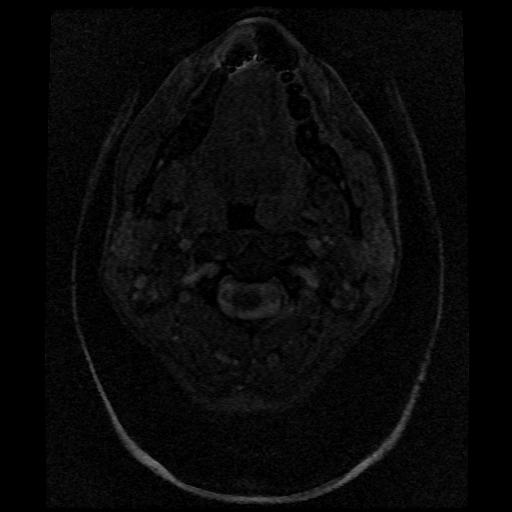
[im 5/215]
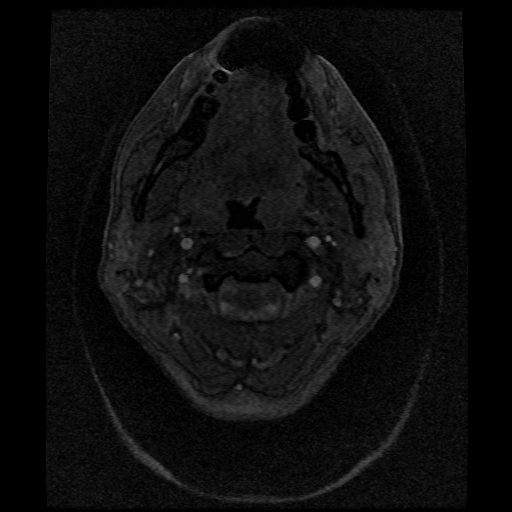
[im 10/215]
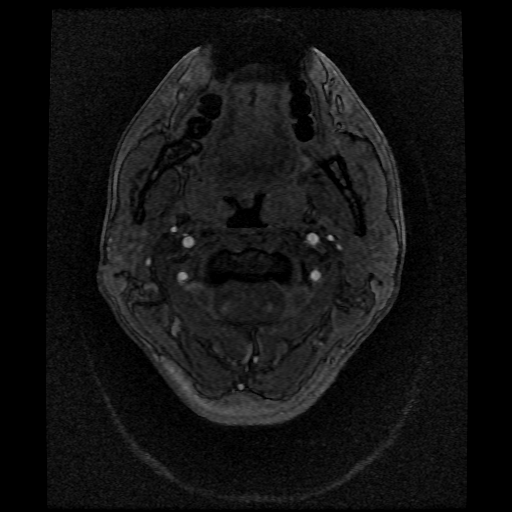
[im 14/215]
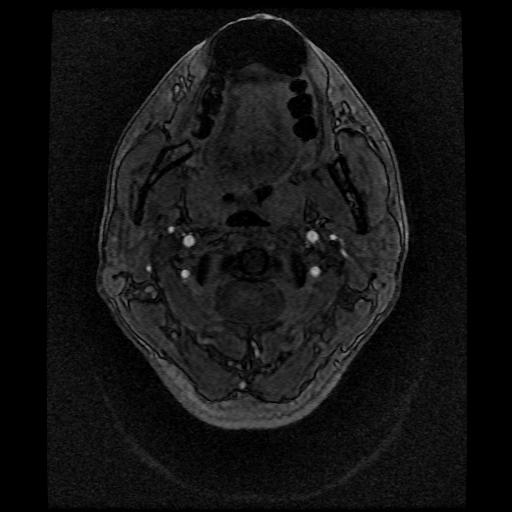
[im 33/215]
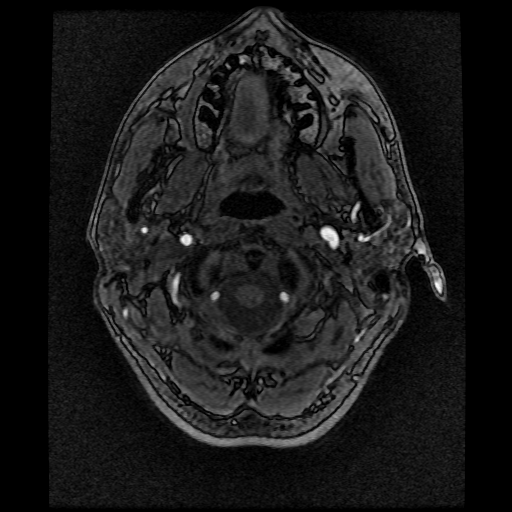
[im 38/215]
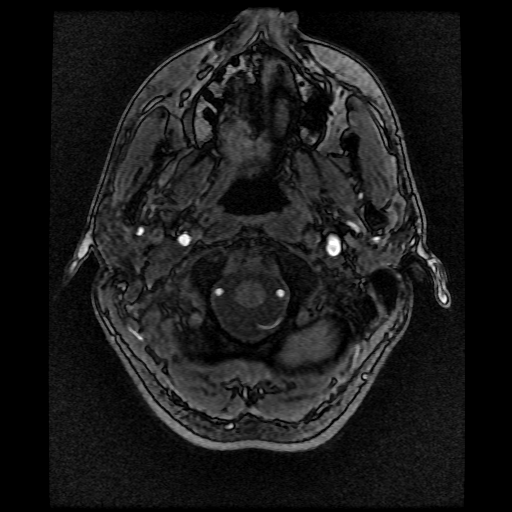
[im 66/215]
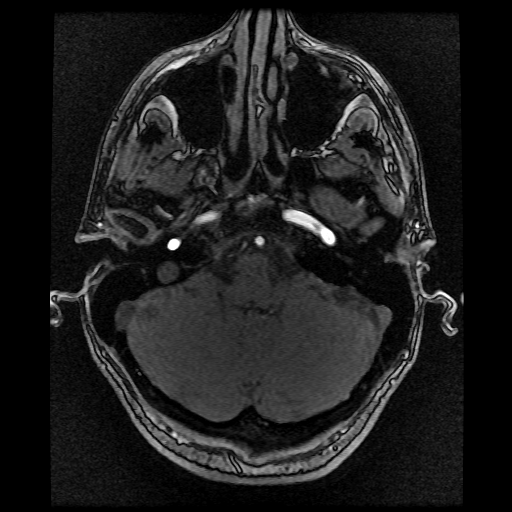
[im 94/215]
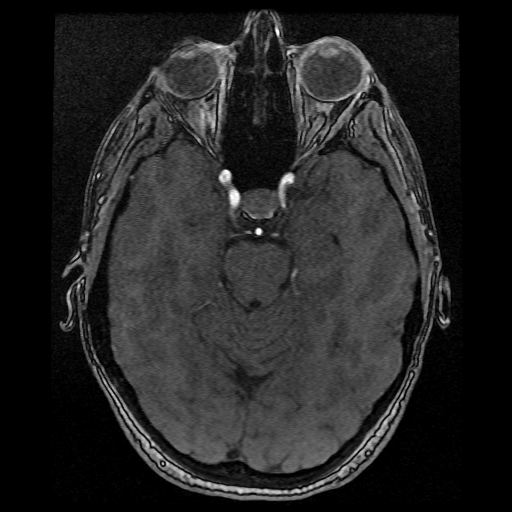
[im 108/215]
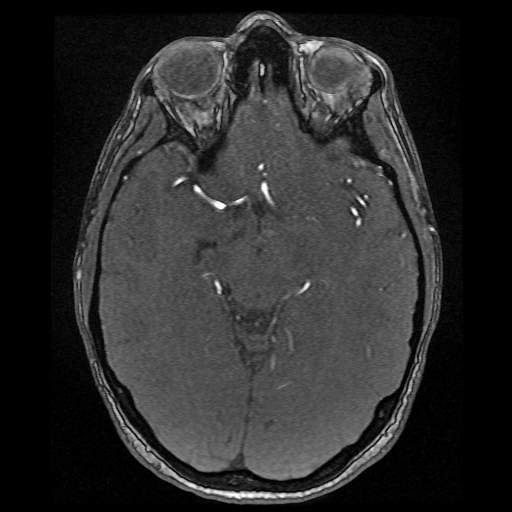
[im 121/215]
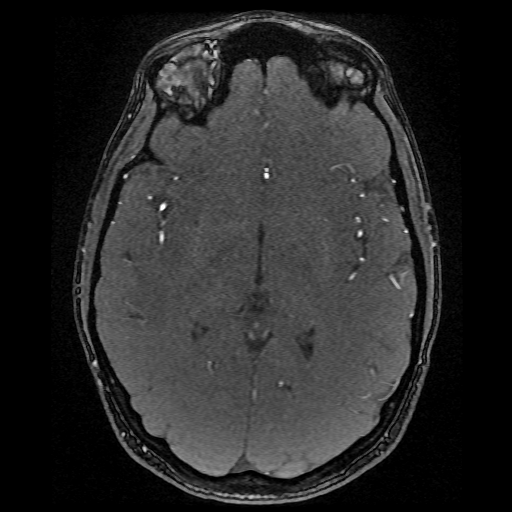
[im 149/215]
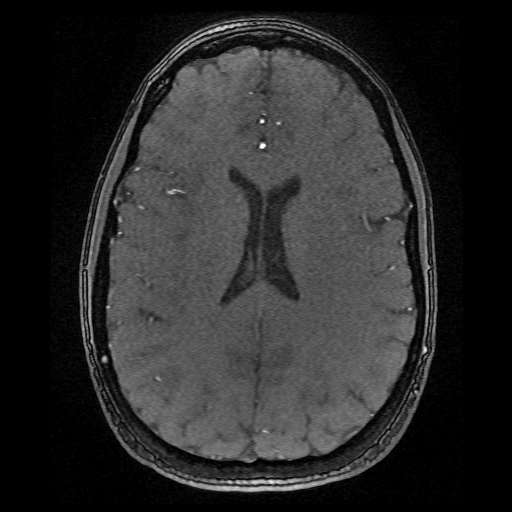
[im 177/215]
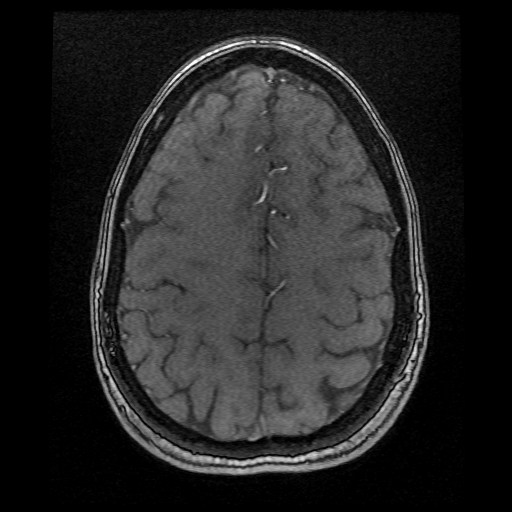
[im 182/215]
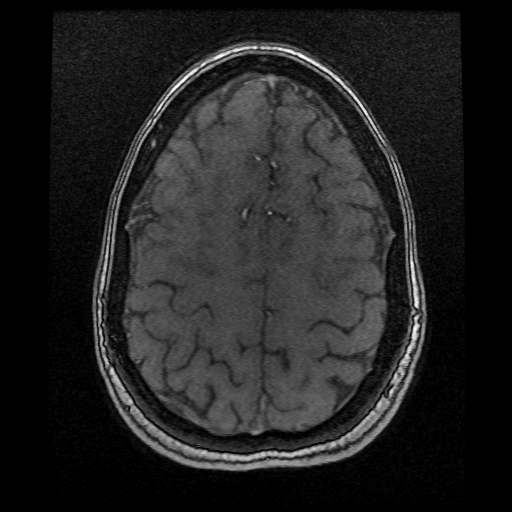
[im 205/215]
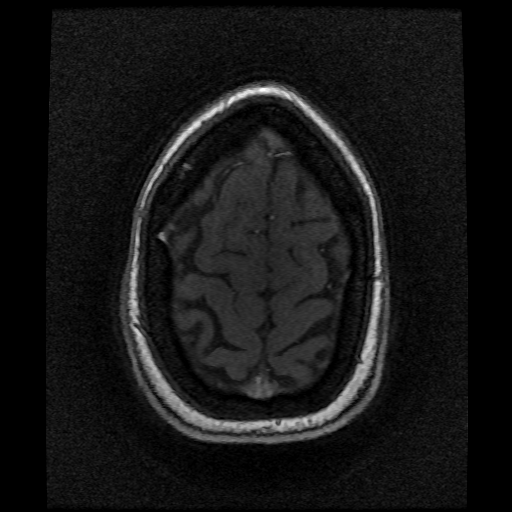

[Series 304: pjn · axial · 1.4mm · 0.20mm/px · 1 of 1 slices shown]
[im 1/1]
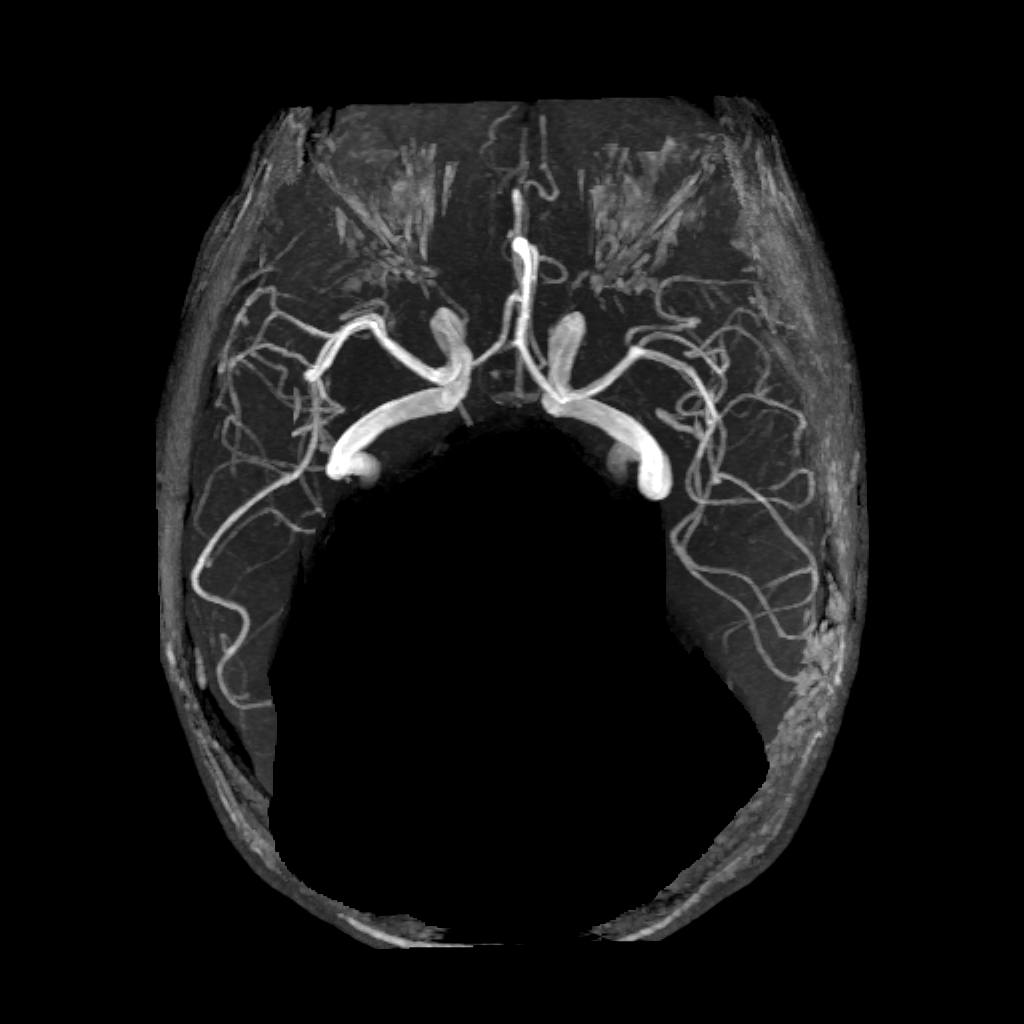

[15 of 48 positions shown; findings below may reference images not displayed]

FINDINGS: MRA NECK FINDINGS

Visualized aortic arch of normal caliber with normal branch pattern.
No high-grade stenosis seen at the origin of the great vessels.
Visualized subclavian arteries are widely patent.

Right common carotid artery patent from its origin to the
bifurcation. No significant atheromatous plaque about the right
bifurcation. Right ICA patent from the bifurcation to the circle of
Willis without evidence for stenosis, dissection, or occlusion.

Left common carotid artery patent from its origin to the
bifurcation. No significant atheromatous plaque about the left
bifurcation. Left ICA patent from the bifurcation to the circle of
Willis without evidence for stenosis, dissection, or occlusion.

Both of the vertebral arteries arise from the subclavian arteries.
Vertebral arteries widely patent without evidence for stenosis,
occlusion, or dissection.

Visualized soft tissues of the neck grossly unremarkable without
acute abnormality.

MRA HEAD FINDINGS

ANTERIOR CIRCULATION:

Visualized distal cervical segments of the internal carotid arteries
are patent with antegrade flow. Petrous, cavernous, and supraclinoid
segments are widely patent. A1 segments, anterior communicating
artery common anterior cerebral arteries well opacified.

M1 segments patent without stenosis or occlusion. MCA bifurcations
normal. Distal MCA branches well opacified and symmetric.

POSTERIOR CIRCULATION:

Vertebral arteries patent to the vertebrobasilar junction. Left
posterior inferior cerebral artery patent. Right posterior inferior
cerebral artery not well visualized on this exam. Basilar artery
widely patent. Superior cerebral arteries patent bilaterally. Both
of the posterior cerebral arteries arise from the basilar artery are
well opacified to their distal aspects. Possible mild atheromatous
regularity within the mid left P2 segment. Small right posterior
communicating artery present.

No aneurysm or vascular malformation.
IMPRESSION: Normal MRA of the head and neck.

## 2019-05-06 ENCOUNTER — Emergency Department
Admission: EM | Admit: 2019-05-06 | Discharge: 2019-05-06 | Disposition: A | Discharge status: Home / Self Care | Payer: Medicaid Other | Source: Ambulatory Visit | Attending: Emergency Medicine | Admitting: Emergency Medicine

## 2019-05-06 ENCOUNTER — Emergency Department: Payer: Medicaid Other

## 2019-05-06 DIAGNOSIS — R1032 Left lower quadrant pain: Secondary | ICD-10-CM | POA: Insufficient documentation

## 2019-05-06 DIAGNOSIS — Q631 Lobulated, fused and horseshoe kidney: Secondary | ICD-10-CM

## 2019-05-06 DIAGNOSIS — N200 Calculus of kidney: Secondary | ICD-10-CM

## 2019-05-06 DIAGNOSIS — N1330 Unspecified hydronephrosis: Secondary | ICD-10-CM

## 2019-05-06 DIAGNOSIS — N210 Calculus in bladder: Secondary | ICD-10-CM

## 2019-05-06 DIAGNOSIS — N23 Unspecified renal colic: Secondary | ICD-10-CM

## 2019-05-06 HISTORY — DX: Lobulated, fused and horseshoe kidney: Q63.1

## 2019-05-06 LAB — URINE MICROSCOPIC (IQ200)
RBC,UA: >50 /hpf — AB (ref 0–3)
WBC,UA: NONE SEEN /hpf (ref 0–5)

## 2019-05-06 LAB — URINALYSIS REFLEX TO CULTURE
Glucose,UA: NEGATIVE mg/dL
Ketones, UA: NEGATIVE mg/dL
Leuk Esterase,UA: NEGATIVE
Nitrite,UA: NEGATIVE
Specific Gravity,UA: 1.036 — ABNORMAL HIGH (ref 1.002–1.030)
pH,UA: 7.5 (ref 5.0–8.0)

## 2019-05-06 LAB — COMPREHENSIVE METABOLIC PANEL
ALT: 16 U/L (ref 0–35)
AST: 24 U/L (ref 0–35)
Albumin: 4.3 g/dL (ref 3.5–5.2)
Alk Phos: 58 U/L (ref 35–105)
Anion Gap: 13 (ref 7–16)
Bilirubin,Total: 0.3 mg/dL (ref 0.0–1.2)
CO2: 22 mmol/L (ref 20–28)
Calcium: 9.6 mg/dL (ref 8.8–10.2)
Chloride: 104 mmol/L (ref 96–108)
Creatinine: 0.9 mg/dL (ref 0.51–0.95)
GFR,Black: 96 *
GFR,Caucasian: 83 *
Glucose: 104 mg/dL — ABNORMAL HIGH (ref 60–99)
Lab: 14 mg/dL (ref 6–20)
Potassium: 3.8 mmol/L (ref 3.4–4.7)
Sodium: 139 mmol/L (ref 133–145)
Total Protein: 7.3 g/dL (ref 6.3–7.7)

## 2019-05-06 LAB — CBC AND DIFFERENTIAL
Baso # K/uL: 0 10*3/uL (ref 0.0–0.1)
Basophil %: 0.3 %
Eos # K/uL: 0.1 10*3/uL (ref 0.0–0.4)
Eosinophil %: 1.5 %
Hematocrit: 40 % (ref 34–45)
Hemoglobin: 13.5 g/dL (ref 11.2–15.7)
IMM Granulocytes #: 0 10*3/uL (ref 0.0–0.0)
IMM Granulocytes: 0.3 %
Lymph # K/uL: 2.6 10*3/uL (ref 1.2–3.7)
Lymphocyte %: 38.1 %
MCH: 31 pg/cell (ref 26–32)
MCHC: 34 g/dL (ref 32–36)
MCV: 91 fL (ref 79–95)
Mono # K/uL: 0.6 10*3/uL (ref 0.2–0.9)
Monocyte %: 8.6 %
Neut # K/uL: 3.5 10*3/uL (ref 1.6–6.1)
Nucl RBC # K/uL: 0 10*3/uL (ref 0.0–0.0)
Nucl RBC %: 0 /100 WBC (ref 0.0–0.2)
Platelets: 227 10*3/uL (ref 160–370)
RBC: 4.4 MIL/uL (ref 3.9–5.2)
RDW: 12.9 % (ref 11.7–14.4)
Seg Neut %: 51.2 %
WBC: 6.9 10*3/uL (ref 4.0–10.0)

## 2019-05-06 LAB — CRP: CRP: <1 mg/L (ref 0–10)

## 2019-05-06 LAB — LIPASE: Lipase: 30 U/L (ref 13–60)

## 2019-05-06 LAB — PREGNANCY TEST, SERUM: Preg,Serum: NEGATIVE

## 2019-05-06 MED ORDER — ONDANSETRON HCL 2 MG/ML IV SOLN *I*
INTRAMUSCULAR | Status: AC
Start: 2019-05-06 — End: 2019-05-06
  Administered 2019-05-06: 4 mg
  Filled 2019-05-06: qty 2

## 2019-05-06 MED ORDER — CYCLOBENZAPRINE HCL 5 MG PO TABS *I*
5.0000 mg | ORAL_TABLET | Freq: Three times a day (TID) | ORAL | 0 refills | Status: AC | PRN
Start: 2019-05-06 — End: 2019-06-05

## 2019-05-06 MED ORDER — SODIUM CHLORIDE 0.9 % IV BOLUS *I*
1000.0000 mL | Freq: Once | Status: AC
Start: 2019-05-06 — End: 2019-05-06
  Administered 2019-05-06: 1000 mL via INTRAVENOUS

## 2019-05-06 MED ORDER — KETOROLAC TROMETHAMINE 30 MG/ML IJ SOLN *I*
30.0000 mg | Freq: Once | INTRAMUSCULAR | Status: AC
Start: 2019-05-06 — End: 2019-05-06

## 2019-05-06 MED ORDER — HYDROCODONE-ACETAMINOPHEN 5-325 MG PO TABS *I*
2.0000 | ORAL_TABLET | Freq: Four times a day (QID) | ORAL | 0 refills | Status: AC | PRN
Start: 2019-05-06 — End: 2019-05-09

## 2019-05-06 MED ORDER — TAMSULOSIN HCL 0.4 MG PO CAPS *I*
0.4000 mg | ORAL_CAPSULE | Freq: Every day | ORAL | 0 refills | Status: DC
Start: 2019-05-06 — End: 2020-04-20

## 2019-05-06 MED ORDER — IOHEXOL 350 MG/ML (OMNIPAQUE) IV SOLN 500ML BOTTLE *I*
1.0000 mL | Freq: Once | INTRAVENOUS | Status: AC
Start: 2019-05-06 — End: 2019-05-06
  Administered 2019-05-06: 95 mL via INTRAVENOUS

## 2019-05-06 MED ORDER — HYDROCODONE-ACETAMINOPHEN 5-325 MG PO TABS *I*
1.0000 | ORAL_TABLET | Freq: Once | ORAL | Status: DC
Start: 2019-05-06 — End: 2019-05-06

## 2019-05-06 MED ORDER — KETOROLAC TROMETHAMINE 30 MG/ML IJ SOLN *I*
INTRAMUSCULAR | Status: AC
Start: 2019-05-06 — End: 2019-05-06
  Administered 2019-05-06: 30 mg via INTRAVENOUS
  Filled 2019-05-06: qty 1

## 2019-05-06 NOTE — ED Triage Notes (Signed)
Pt presents to the ED today for c/o right flank pain that radiates to her abd. Pt states that her pain started a few days ago and has progressively become worse. She denies fever and has no urinaey complaints. Pt appears  uncomfortable but, in NAD  Triage Note   Reather Converse, RN

## 2019-05-06 NOTE — ED Notes (Addendum)
Discharge instructions reviewed with patient and patient verbalized understanding. IV removed, belongings returned to patient. Patient ambulatory out of ED, VSS at time of D/C.  Urine strainer given and specimen cup provided with patient verbalizing understanding of use. Patient asking about CT result specifics and instructed to ask Urology MD during follow up as he can provide most detailed explanation. Work note provided. Prescription given and medications discussed including precautions and avoidance of driving and alcohol use. Paper prescription for Norco given.  Discharge Instructions reviewed with patient.  The patient expressed understanding and is ready to be discharged.  Due to COVID-19 Infection Risk Reduction measures, patient and [facility] staff member are not using a pen to sign the Discharge Instructions.

## 2019-05-06 NOTE — ED Notes (Signed)
Was at work and had sudden onset of rlq pain radiating to back pt states has hx kidney stones

## 2019-05-06 NOTE — ED Provider Notes (Signed)
History     Chief Complaint   Patient presents with    Flank Pain     right    Abdominal Pain     right side      33 year old woman who comes for vague intermittent left flank pain which wraps around toward her right groin for 2 days which became particularly worse at work this morning at the [employer].  It was moderate with no modifiers.  She states she may have been told she had small kidney stones long ago.  She denies dysuria, fevers, chills, nausea or vomiting.  She says the pain goes down into the top of her right thigh and does seem to be worse when she sits up with increased pain right in the right groin.  She has no bladder or bowel dysfunction and no previous history of back problems.          Medical/Surgical/Family History     Past Medical History:   Diagnosis Date    Horseshoe kidney         There is no problem list on file for this patient.           History reviewed. No pertinent surgical history.  No family history on file.       Social History     Tobacco Use    Smoking status: Never Smoker    Smokeless tobacco: Never Used   Substance Use Topics    Alcohol use: Not Currently    Drug use: Not Currently     Living Situation     Questions Responses    Patient lives with     Homeless     Caregiver for other family member     External Services     Employment     Domestic Violence Risk                 Review of Systems   Review of Systems   Constitutional: Negative for fatigue.   HENT: Negative for hearing loss, nosebleeds, sore throat, tinnitus and trouble swallowing.    Eyes: Negative for photophobia, discharge and visual disturbance.   Respiratory: Negative for cough, chest tightness, shortness of breath, wheezing and stridor.    Cardiovascular: Negative for chest pain, palpitations and leg swelling.   Gastrointestinal: Negative for abdominal pain, blood in stool, diarrhea, nausea and vomiting.   Genitourinary: Positive for flank pain. Negative for difficulty urinating, dysuria, frequency,  hematuria, pelvic pain and vaginal bleeding.   Musculoskeletal: Negative for back pain and joint swelling.   Allergic/Immunologic: Negative for immunocompromised state.   Neurological: Negative for dizziness, tremors, seizures, weakness, numbness and headaches.   Hematological: Negative for adenopathy. Does not bruise/bleed easily.       Physical Exam     Triage Vitals  Triage Start: Start, (05/06/19 0746)   First Recorded BP: 146/67, Resp: 19, Temp: 36.8 C (98.2 F), Temp src: TEMPORAL Oxygen Therapy SpO2: 100 %, O2 Device: None (Room air), Heart Rate: 87, (05/06/19 0747) Heart Rate (via Pulse Ox): 87, (05/06/19 0747).  First Pain Reported  0-10 Scale: 10, (05/06/19 0747)       Physical Exam  Vitals signs and nursing note reviewed.   Constitutional:       Appearance: She is well-developed.   HENT:      Head: Normocephalic and atraumatic.      Right Ear: External ear normal.      Left Ear: External ear normal.  Nose: Nose normal.   Eyes:      Conjunctiva/sclera: Conjunctivae normal.   Neck:      Musculoskeletal: Normal range of motion and neck supple.      Trachea: No tracheal deviation.   Cardiovascular:      Rate and Rhythm: Normal rate and regular rhythm.      Heart sounds: Normal heart sounds.   Pulmonary:      Effort: Pulmonary effort is normal. No respiratory distress.      Breath sounds: Normal breath sounds.   Abdominal:      General: Bowel sounds are normal. There is no distension.      Palpations: Abdomen is soft.      Tenderness: There is abdominal tenderness (Mild right abdomen and in her groin; femoral pulses are full and equal). There is no right CVA tenderness, guarding or rebound.      Hernia: No hernia is present.   Musculoskeletal:         General: No tenderness.      Right lower leg: No edema.      Left lower leg: No edema.      Comments: Negative straight leg raising; distal reflexes full and equal   Lymphadenopathy:      Cervical: No cervical adenopathy.   Skin:     General: Skin is warm  and dry.      Findings: No rash.   Neurological:      General: No focal deficit present.      Mental Status: She is alert and oriented to person, place, and time.   Psychiatric:         Behavior: Behavior normal.         Medical Decision Making     Assessment:  33 year old woman comes for moderate left flank pain which radiates to her right groin without urinary symptoms or history of recent trauma.    Differential diagnosis:  Renal colic, pyelonephritis, appendicitis, low back pain with sciatica    Plan:  Orders Placed This Encounter      Urinalysis reflex to culture      CT abdomen and pelvis with contrast      CBC and differential      Comprehensive metabolic panel      C reactive protein      Lipase      Pregnancy Test, Serum      Urinalysis with reflex to Microscopic UA and reflex to Bacterial Culture      Urine microscopic ([id number])      Insert peripheral IV        Independent review of: Existing labs, CT scans    ED Course and Disposition:  UA IS CLEAN EXCEPT FOR BLOOD.  Her pain was relieved with Toradol.  She is asymptomatic at the time of discharge.  CT shows either a recently passed 3 mm stone or in the process of being passed into the bladder.  I will assist the passage by prescribing the usual meds: Flomax, Flexeril, and Norco; and she will strain her urine for the stone.  She will return as needed for intractable pain, fever, vomiting and follow-up with urology as needed.            Orson Eva, MD          Orson Eva, MD  05/09/19 [phone]

## 2019-05-06 NOTE — Discharge Instructions (Signed)
Strain your urine for the stone(which is probably in her bladder at this time).    Use ibuprofen to help with the pain.

## 2020-04-20 ENCOUNTER — Other Ambulatory Visit: Payer: Self-pay

## 2020-04-20 ENCOUNTER — Observation Stay: Payer: Medicaid Other | Admitting: Anesthesiology

## 2020-04-20 ENCOUNTER — Observation Stay: Payer: Medicaid Other

## 2020-04-20 ENCOUNTER — Encounter
Admission: EM | Disposition: A | Discharge status: Home / Self Care | Payer: Self-pay | Source: Other Acute Inpatient Hospital | Attending: Emergency Medicine

## 2020-04-20 ENCOUNTER — Encounter: Payer: Self-pay | Admitting: Gastroenterology

## 2020-04-20 ENCOUNTER — Observation Stay
Admission: EM | Admit: 2020-04-20 | Discharge: 2020-04-22 | Disposition: A | Discharge status: Home / Self Care | Payer: Medicaid Other | Source: Other Acute Inpatient Hospital | Attending: Urology | Admitting: Urology

## 2020-04-20 ENCOUNTER — Emergency Department: Admit: 2020-04-20 | Discharge: 2020-04-20 | Disposition: A | Discharge status: Home / Self Care | Payer: Medicaid Other

## 2020-04-20 DIAGNOSIS — Q631 Lobulated, fused and horseshoe kidney: Secondary | ICD-10-CM | POA: Insufficient documentation

## 2020-04-20 DIAGNOSIS — R350 Frequency of micturition: Secondary | ICD-10-CM

## 2020-04-20 DIAGNOSIS — R112 Nausea with vomiting, unspecified: Secondary | ICD-10-CM | POA: Insufficient documentation

## 2020-04-20 DIAGNOSIS — N200 Calculus of kidney: Secondary | ICD-10-CM

## 2020-04-20 DIAGNOSIS — N202 Calculus of kidney with calculus of ureter: Secondary | ICD-10-CM

## 2020-04-20 DIAGNOSIS — Z20822 Contact with and (suspected) exposure to covid-19: Secondary | ICD-10-CM | POA: Insufficient documentation

## 2020-04-20 DIAGNOSIS — N132 Hydronephrosis with renal and ureteral calculous obstruction: Principal | ICD-10-CM | POA: Insufficient documentation

## 2020-04-20 DIAGNOSIS — N1330 Unspecified hydronephrosis: Secondary | ICD-10-CM

## 2020-04-20 DIAGNOSIS — Z87440 Personal history of urinary (tract) infections: Secondary | ICD-10-CM | POA: Insufficient documentation

## 2020-04-20 DIAGNOSIS — N201 Calculus of ureter: Secondary | ICD-10-CM

## 2020-04-20 HISTORY — PX: PR CYSTO W/INSERT URETERAL STENT: 52332

## 2020-04-20 HISTORY — PX: PR CYSTOSCOPY,INSERT URETERAL STENT: 52332

## 2020-04-20 LAB — BASIC METABOLIC PANEL
Anion Gap: 13 (ref 7–16)
CO2: 20 mmol/L (ref 20–28)
Calcium: 9.3 mg/dL (ref 8.8–10.2)
Chloride: 107 mmol/L (ref 96–108)
Creatinine: 0.69 mg/dL (ref 0.51–0.95)
GFR,Black: 129 *
GFR,Caucasian: 112 *
Glucose: 109 mg/dL — ABNORMAL HIGH (ref 60–99)
Lab: 11 mg/dL (ref 6–20)
Potassium: 4.5 mmol/L (ref 3.3–5.1)
Sodium: 140 mmol/L (ref 133–145)

## 2020-04-20 LAB — CBC AND DIFFERENTIAL
Baso # K/uL: 0.2 10*3/uL — ABNORMAL HIGH (ref 0.0–0.1)
Baso # K/uL: 0 10*3/uL (ref 0.0–0.1)
Basophil %: 0.9 %
Basophil %: 0.1 %
Eos # K/uL: 0 10*3/uL (ref 0.0–0.4)
Eos # K/uL: 0 10*3/uL (ref 0.0–0.4)
Eosinophil %: 0 %
Eosinophil %: 0 %
Hematocrit: 40 % (ref 34–45)
Hematocrit: 35 % (ref 34–45)
Hemoglobin: 12.5 g/dL (ref 11.2–15.7)
Hemoglobin: 11 g/dL — ABNORMAL LOW (ref 11.2–15.7)
IMM Granulocytes #: 0.1 10*3/uL — ABNORMAL HIGH (ref 0.0–0.0)
IMM Granulocytes: 0.4 %
Lymph # K/uL: 0.9 10*3/uL — ABNORMAL LOW (ref 1.2–3.7)
Lymph # K/uL: 0.8 10*3/uL — ABNORMAL LOW (ref 1.2–3.7)
Lymphocyte %: 5.2 %
Lymphocyte %: 6.8 %
MCH: 30 pg (ref 26–32)
MCH: 30 pg (ref 26–32)
MCHC: 31 g/dL — ABNORMAL LOW (ref 32–36)
MCHC: 32 g/dL (ref 32–36)
MCV: 95 fL (ref 79–95)
MCV: 95 fL (ref 79–95)
Mono # K/uL: 1.1 10*3/uL — ABNORMAL HIGH (ref 0.2–0.9)
Mono # K/uL: 0.2 10*3/uL (ref 0.2–0.9)
Monocyte %: 6.1 %
Monocyte %: 1.5 %
Neut # K/uL: 15.7 10*3/uL — ABNORMAL HIGH (ref 1.6–6.1)
Neut # K/uL: 10.8 10*3/uL — ABNORMAL HIGH (ref 1.6–6.1)
Nucl RBC # K/uL: 0 10*3/uL (ref 0.0–0.0)
Nucl RBC # K/uL: 0 10*3/uL (ref 0.0–0.0)
Nucl RBC %: 0 /100 WBC (ref 0.0–0.2)
Nucl RBC %: 0 /100 WBC (ref 0.0–0.2)
Platelets: 179 10*3/uL (ref 160–370)
Platelets: 166 10*3/uL (ref 160–370)
RBC: 4.2 MIL/uL (ref 3.9–5.2)
RBC: 3.7 MIL/uL — ABNORMAL LOW (ref 3.9–5.2)
RDW: 12.6 % (ref 11.7–14.4)
RDW: 12.6 % (ref 11.7–14.4)
Seg Neut %: 87.8 %
Seg Neut %: 91.2 %
WBC: 17.8 10*3/uL — ABNORMAL HIGH (ref 4.0–10.0)
WBC: 11.8 10*3/uL — ABNORMAL HIGH (ref 4.0–10.0)

## 2020-04-20 LAB — RUQ PANEL (ED ONLY)
ALT: 14 U/L (ref 0–35)
AST: 17 U/L (ref 0–35)
Albumin: 4.3 g/dL (ref 3.5–5.2)
Alk Phos: 66 U/L (ref 35–105)
Amylase: 31 U/L (ref 28–100)
Bilirubin,Direct: <0.2 mg/dL (ref 0.0–0.3)
Bilirubin,Total: 0.4 mg/dL (ref 0.0–1.2)
Lipase: 21 U/L (ref 13–60)
Total Protein: 7 g/dL (ref 6.3–7.7)

## 2020-04-20 LAB — HOLD GREEN WITH GEL

## 2020-04-20 LAB — DIFF MANUAL: Diff Based On: 115 CELLS

## 2020-04-20 LAB — URINALYSIS WITH MICROSCOPIC
Glucose,UA: NEGATIVE mg/dL
Nitrite,UA: POSITIVE — AB
RBC,UA: >50 /hpf — AB (ref 0–2)
Specific Gravity,UA: >=1.045 — AB (ref 1.002–1.030)
WBC,UA: >50 /hpf — AB (ref 0–5)
pH,UA: 6 (ref 5.0–8.0)

## 2020-04-20 LAB — HOLD BLUE

## 2020-04-20 LAB — COVID-19 NAAT (PCR): COVID-19 NAAT (PCR): NEGATIVE

## 2020-04-20 LAB — PERFORMING LAB

## 2020-04-20 LAB — COVID-19 PCR

## 2020-04-20 LAB — GRAM STAIN: Gram Stain: 0

## 2020-04-20 SURGERY — CYSTOSCOPY, WITH URETERAL STENT INSERTION
Anesthesia: General | Site: Ureter | Laterality: Left | Wound class: Clean Contaminated

## 2020-04-20 MED ORDER — ONDANSETRON HCL 2 MG/ML IV SOLN *I*
INTRAMUSCULAR | Status: DC | PRN
Start: 2020-04-20 — End: 2020-04-20
  Administered 2020-04-20: 4 mg via INTRAVENOUS

## 2020-04-20 MED ORDER — FENTANYL CITRATE 50 MCG/ML IJ SOLN *WRAPPED*
INTRAMUSCULAR | Status: AC
Start: 2020-04-20 — End: 2020-04-20
  Filled 2020-04-20: qty 2

## 2020-04-20 MED ORDER — OXYBUTYNIN CHLORIDE 5 MG PO TABS *I*
5.0000 mg | ORAL_TABLET | Freq: Three times a day (TID) | ORAL | 0 refills | Status: AC | PRN
Start: 2020-04-20 — End: ?
  Filled 2020-04-20: qty 15, 5d supply, fill #0

## 2020-04-20 MED ORDER — DEXTROSE 5 % FLUSH FOR PUMPS *I*
0.0000 mL/h | INTRAVENOUS | Status: DC | PRN
Start: 2020-04-20 — End: 2020-04-22

## 2020-04-20 MED ORDER — OXYBUTYNIN CHLORIDE 5 MG PO TABS *I*
5.0000 mg | ORAL_TABLET | Freq: Three times a day (TID) | ORAL | Status: DC | PRN
Start: 2020-04-20 — End: 2020-04-22
  Administered 2020-04-20 – 2020-04-21 (×3): 5 mg via ORAL
  Filled 2020-04-20 (×2): qty 1

## 2020-04-20 MED ORDER — MIDAZOLAM HCL 1 MG/ML IJ SOLN *I* WRAPPED
INTRAMUSCULAR | Status: DC | PRN
Start: 2020-04-20 — End: 2020-04-20
  Administered 2020-04-20: 2 mg via INTRAVENOUS

## 2020-04-20 MED ORDER — ONDANSETRON HCL 2 MG/ML IV SOLN *I*
1.0000 mg | Freq: Once | INTRAMUSCULAR | Status: DC | PRN
Start: 2020-04-20 — End: 2020-04-20

## 2020-04-20 MED ORDER — ONDANSETRON HCL 2 MG/ML IV SOLN *I*
4.0000 mg | Freq: Four times a day (QID) | INTRAMUSCULAR | Status: DC | PRN
Start: 2020-04-20 — End: 2020-04-22

## 2020-04-20 MED ORDER — FENTANYL CITRATE 50 MCG/ML IJ SOLN *WRAPPED*
INTRAMUSCULAR | Status: DC | PRN
Start: 2020-04-20 — End: 2020-04-20
  Administered 2020-04-20 (×2): 50 ug via INTRAVENOUS

## 2020-04-20 MED ORDER — TAMSULOSIN HCL 0.4 MG PO CAPS *I*
0.4000 mg | ORAL_CAPSULE | Freq: Every evening | ORAL | Status: DC
Start: 2020-04-20 — End: 2020-04-22
  Administered 2020-04-20 – 2020-04-21 (×2): 0.4 mg via ORAL
  Filled 2020-04-20 (×3): qty 1

## 2020-04-20 MED ORDER — HYDROMORPHONE HCL PF 1 MG/ML IJ SOLN *WRAPPED*
1.0000 mg | INTRAMUSCULAR | Status: DC | PRN
Start: 2020-04-20 — End: 2020-04-22

## 2020-04-20 MED ORDER — ONDANSETRON HCL 2 MG/ML IV SOLN *I*
4.0000 mg | Freq: Two times a day (BID) | INTRAMUSCULAR | Status: DC | PRN
Start: 2020-04-20 — End: 2020-04-20

## 2020-04-20 MED ORDER — PROPOFOL 10 MG/ML IV EMUL (INTERMITTENT DOSING) WRAPPED *I*
INTRAVENOUS | Status: DC | PRN
Start: 2020-04-20 — End: 2020-04-20
  Administered 2020-04-20: 150 mg via INTRAVENOUS

## 2020-04-20 MED ORDER — HYDROMORPHONE HCL PF 1 MG/ML IJ SOLN *WRAPPED*
0.5000 mg | INTRAMUSCULAR | Status: DC | PRN
Start: 2020-04-20 — End: 2020-04-20

## 2020-04-20 MED ORDER — DEXAMETHASONE SODIUM PHOSPHATE 4 MG/ML INJ SOLN *WRAPPED*
INTRAMUSCULAR | Status: DC | PRN
Start: 2020-04-20 — End: 2020-04-20
  Administered 2020-04-20: 4 mg via INTRAVENOUS

## 2020-04-20 MED ORDER — OXYBUTYNIN CHLORIDE 5 MG PO TABS *I*
ORAL_TABLET | ORAL | Status: AC
Start: 2020-04-20 — End: 2020-04-20
  Filled 2020-04-20: qty 1

## 2020-04-20 MED ORDER — LACTATED RINGERS IV SOLN *I*
125.0000 mL/h | INTRAVENOUS | Status: DC
Start: 2020-04-20 — End: 2020-04-20
  Administered 2020-04-20: 125 mL/h via INTRAVENOUS
  Administered 2020-04-20 (×3): 125 mL/h
  Administered 2020-04-20: 125 mL/h via INTRAVENOUS
  Administered 2020-04-20 (×2): 125 mL/h

## 2020-04-20 MED ORDER — SODIUM CHLORIDE 0.9 % 25 ML IV SOLN *I*
6.2500 mg | Freq: Once | INTRAVENOUS | Status: DC | PRN
Start: 2020-04-20 — End: 2020-04-20

## 2020-04-20 MED ORDER — OXYCODONE HCL 10 MG PO TABS *I*
10.0000 mg | ORAL_TABLET | Freq: Four times a day (QID) | ORAL | Status: DC | PRN
Start: 2020-04-20 — End: 2020-04-22
  Administered 2020-04-20 (×2): 10 mg via ORAL
  Filled 2020-04-20: qty 1

## 2020-04-20 MED ORDER — ACETAMINOPHEN 325 MG PO TABS *I*
650.0000 mg | ORAL_TABLET | Freq: Four times a day (QID) | ORAL | Status: DC | PRN
Start: 2020-04-20 — End: 2020-04-22

## 2020-04-20 MED ORDER — SODIUM CHLORIDE 0.9 % FLUSH FOR PUMPS *I*
0.0000 mL/h | INTRAVENOUS | Status: DC | PRN
Start: 2020-04-20 — End: 2020-04-22

## 2020-04-20 MED ORDER — PHENAZOPYRIDINE HCL 100 MG PO TABS *I*
100.0000 mg | ORAL_TABLET | Freq: Three times a day (TID) | ORAL | 0 refills | Status: AC | PRN
Start: 2020-04-20 — End: ?
  Filled 2020-04-20: qty 15, 5d supply, fill #0

## 2020-04-20 MED ORDER — CEPHALEXIN 500 MG PO CAPS *I*
500.0000 mg | ORAL_CAPSULE | Freq: Four times a day (QID) | ORAL | 0 refills | Status: DC
Start: 2020-04-20 — End: 2020-04-21
  Filled 2020-04-20: qty 28, 7d supply, fill #0

## 2020-04-20 MED ORDER — ACETAMINOPHEN 10 MG/ML IV SOLN *I*
INTRAVENOUS | Status: DC | PRN
Start: 2020-04-20 — End: 2020-04-20
  Administered 2020-04-20: 1000 mg via INTRAVENOUS

## 2020-04-20 MED ORDER — OXYCODONE HCL 10 MG PO TABS *I*
ORAL_TABLET | ORAL | Status: AC
Start: 2020-04-20 — End: 2020-04-20
  Filled 2020-04-20: qty 1

## 2020-04-20 MED ORDER — LIDOCAINE HCL 2 % IJ SOLN *I*
INTRAMUSCULAR | Status: DC | PRN
Start: 2020-04-20 — End: 2020-04-20
  Administered 2020-04-20: 80 mg via INTRAVENOUS

## 2020-04-20 MED ORDER — HALOPERIDOL LACTATE 5 MG/ML IJ SOLN *I*
INTRAMUSCULAR | Status: DC | PRN
Start: 2020-04-20 — End: 2020-04-20
  Administered 2020-04-20: 1 mg via INTRAVENOUS

## 2020-04-20 MED ORDER — TAMSULOSIN HCL 0.4 MG PO CAPS *I*
0.4000 mg | ORAL_CAPSULE | Freq: Every day | ORAL | 0 refills | Status: AC
Start: 2020-04-20 — End: 2020-05-07
  Filled 2020-04-20: qty 15, 15d supply, fill #0

## 2020-04-20 MED ORDER — OXYCODONE HCL 5 MG PO TABS *I*
5.0000 mg | ORAL_TABLET | Freq: Four times a day (QID) | ORAL | 0 refills | Status: AC | PRN
Start: 2020-04-20 — End: ?
  Filled 2020-04-20: qty 5, 2d supply, fill #0

## 2020-04-20 MED ORDER — MIDAZOLAM HCL 1 MG/ML IJ SOLN *I* WRAPPED
INTRAMUSCULAR | Status: AC
Start: 2020-04-20 — End: 2020-04-20
  Filled 2020-04-20: qty 2

## 2020-04-20 MED ORDER — CEFTRIAXONE SODIUM 1 G IN STERILE WATER 10ML SYRINGE *I*
1000.0000 mg | INTRAVENOUS | Status: DC
Start: 2020-04-21 — End: 2020-04-22
  Administered 2020-04-20 – 2020-04-21 (×2): 1000 mg via INTRAVENOUS
  Filled 2020-04-20 (×2): qty 1000

## 2020-04-20 MED ORDER — EPHEDRINE 5MG/ML IN NS IV/IJ *WRAPPED*
INTRAMUSCULAR | Status: DC | PRN
Start: 2020-04-20 — End: 2020-04-20
  Administered 2020-04-20: 5 mg via INTRAVENOUS

## 2020-04-20 MED ORDER — OXYCODONE HCL 5 MG PO TABS *I*
5.0000 mg | ORAL_TABLET | Freq: Four times a day (QID) | ORAL | Status: DC | PRN
Start: 2020-04-20 — End: 2020-04-22
  Administered 2020-04-21 – 2020-04-22 (×3): 5 mg via ORAL
  Filled 2020-04-20 (×3): qty 1

## 2020-04-20 SURGICAL SUPPLY — 22 items
BAG DRAIN URINARY 600ML (Supply) IMPLANT
BAG URO DRAIN (Supply) ×2
BAG URO DRN FOR SIEMENS ST LTX FREE PVC DISP/SNGLE USE (Supply) ×1 IMPLANT
CABLE MONOPOLAR DISPOSABLE (Supply) IMPLANT
CABLE MONOPOLAR L300CM UNIPOLAR STRAIGHT HIGH FREQ (Supply) IMPLANT
CATH URETERAL OPEN END 5FR (Cardiac Catheter (Interventional)) ×2 IMPLANT
FILTER NEPTUNE 4PORT MANIFOLD (Supply) ×3 IMPLANT
FOAM POSITIONERS YELLOW (Other) ×4 IMPLANT
GLOVE BIOGEL PI MICRO IND UNDER SZ 6.0 LF (Glove) ×3 IMPLANT
GLOVE BIOGEL PI MICRO IND UNDER SZ 7.5 LF (Glove) ×2 IMPLANT
GLOVE BIOGEL PI MICRO SZ 6 (Glove) ×2 IMPLANT
GUIDEWIRE DUAL FLEX STR .035X150CM SENSOR (Guidewire) ×2 IMPLANT
PACK CUSTOM CYSTO PK (Pack) ×3 IMPLANT
PAD GROUNDING ADULT SURE FIT (Supply) ×1 IMPLANT
PREP CHG DYNA-HEX LIQUID 4PCT 4OZ (Other) ×3 IMPLANT
SLEEVE COMP KNEE HI MED (Supply) ×3 IMPLANT
SOL SOD CHL IRRIG 1500ML BTL (Solution) ×3 IMPLANT
STENT URETERAL QUADRA-COIL 6.0FRX22-28 (Stent (Non-Cardiac)) ×2 IMPLANT
SYSTEM SGL ACTION PUMPING (Supply) ×2 IMPLANT
TUBE HOLDER, CATH-SECURE STD LUMEN (Cardiac Catheter (Interventional)) IMPLANT
TUBING MEDI-VAC NONCONDUC 12FT X 3/16IN (Tubing) ×2 IMPLANT
Y ADAPTER GATEWAY ADVANTAGE (Supply) ×2 IMPLANT

## 2020-04-20 NOTE — Plan of Care (Signed)
Problem: Safety  Goal: Patient will remain free of falls  Outcome: Maintaining  Goal: Prevent any intentional injury  Outcome: Maintaining     Problem: Pain/Comfort  Goal: Patient's pain or discomfort is manageable  Outcome: Maintaining     Problem: Nutrition  Goal: Patient's nutritional status is maintained or improved  Outcome: Maintaining     Problem: Mobility  Goal: Patient's functional status is maintained or improved  Outcome: Maintaining

## 2020-04-20 NOTE — Interval H&P Note (Signed)
UPDATES TO PATIENT'S CONDITION on the DAY OF SURGERY/PROCEDURE    I. Updates to Patient's Condition (to be completed by a provider privileged to complete a H&P, following reassessment of the patient by the provider):    Day of Surgery/Procedure Update:  History  History reviewed and no change    Physical  Physical exam updated and no change            II. Procedure Readiness   I have reviewed the patient's H&P and updated condition. By completing and signing this form, I attest that this patient is ready for surgery/procedure.    III. Attestation   I have reviewed the updated information regarding the patient's condition and it is appropriate to proceed with the planned surgery/procedure.    I have reminded Nancy Charles that COVID-19 is still present in our community. She was advised that [facility] and its affiliates have made deliberate and widespread changes to policies and procedures, consistent with applicable directives, in order to reduce the risk of exposure in our facilities.     I further explained and Nancy Charles understands that given the communicability of the SARS CoV2 coronavirus, there remains a small but real risk of contracting the disease while receiving perioperative care - even with stringent preventive measures in place.   Nancy Charles understands the potential consequences of COVID-19 disease as it relates to their planned procedure and anticipated postoperative course.      The patient and I have considered and discussed the relative risks and benefits of proceeding with his/her surgery - both in terms of the procedure itself, and also in the context of the ongoing pandemic.  Nancy Charles wishes to proceed with the procedure.     Hector Brunswick, MD as of 2:41 PM 04/20/2020

## 2020-04-20 NOTE — Anesthesia Postprocedure Evaluation (Signed)
Anesthesia Post-Op Note    Patient: Nancy Charles    Procedure(s) Performed:  Procedure Summary  Date:  04/20/2020 Anesthesia Start: 04/20/2020  2:42 PM Anesthesia Stop: 04/20/2020  3:25 PM Room / Location:  [id number] / [facility] MAIN OR   Procedure(s):  CYSTOSCOPY, WITH LEFT URETERAL STENT PLACEMENT Diagnosis:  Kidney stone on left side [N20.0] Surgeon(s):  Hector Brunswick, MD  Marcia Brash, MD Responsible Anesthesia Provider:  Tennis Ship, MBBCh         Recovery Vitals  BP: 99/62 (04/20/2020  4:00 PM)  Heart Rate: 74 (04/20/2020  4:00 PM)  Heart Rate (via Pulse Ox): 73 (04/20/2020  4:00 PM)  Resp: 16 (04/20/2020  4:00 PM)  Temp: 36 C (96.8 F) (04/20/2020  3:24 PM)  SpO2: 99 % (04/20/2020  4:00 PM)  O2 Flow Rate: 2 L/min (04/20/2020  3:30 PM)    Anesthesia type:  general  Complications Noted During Procedure or in PACU:  None   Comment:    Patient Location:  PACU  Level of Consciousness:    Recovered to baseline, alert and awake  Patient Participation:     Able to participate  Temperature Status:    Normothermic  Oxygen Saturation:    Within patient's normal range  Cardiac Status:   Within patient's normal range  Fluid Status:    Stable  Airway Patency:     Yes  Pulmonary Status:    Baseline and stable  Pain Management:    Adequate analgesia  Nausea and Vomiting:  None    Post Op Assessment:    Tolerated procedure well  Attending Attestation:  All indicated post anesthesia care provided       -

## 2020-04-20 NOTE — Provider Consult (Signed)
UROLOGY CONSULT NOTE    Asked by primary team to evaluate patient for infected nephrolithiasis.    HPI:  Nancy Charles is a 34 y.o. female with a history of nephrolithiasis, recurrent UTIs, and a horseshoe kidney who is presenting as a transfer from [name] due to concern for an infected stone.  According to the consult request, the patient had acute onset L flank pain leading her to present to [facility] general.  There she was scanned and her urine was concerning for infection. She was given a dose of ceftriaxone and transferred. Here, she is afebrile and hemodynamically stable. She states she was recently treated for a UTI.    OSH labs:  W - 8.5  Cr - 0.9  UA - amber, pos nitrite, 250 LE, 51 squam, 32 WBC    Labs [facility]:  W - 17.8    CT (OSH) - "obstructing L kidney stone with hydronephrosis"  ROS:  As in HPI    PMH:    Past Medical History:   Diagnosis Date    Horseshoe kidney        PSH:  No past surgical history on file.    Allergies:   No Known Allergies (drug, envir, food or latex)    Home Meds:  (Not in a hospital admission)      Current Medications:  Current Facility-Administered Medications   Medication Dose Route Frequency    ondansetron (ZOFRAN) injection 4 mg  4 mg Intravenous Q6H PRN    HYDROmorphone PF (DILAUDID) injection 1 mg  1 mg Intravenous Q2H PRN    Lactated Ringers Infusion  125 mL/hr Intravenous Continuous     Current Outpatient Medications   Medication    MELATONIN PO    BIOTIN PO    tamsulosin (FLOMAX) 0.4 MG capsule        Social History:   Social History     Socioeconomic History    Marital status: Single     Spouse name: Not on file    Number of children: Not on file    Years of education: Not on file    Highest education level: Not on file   Tobacco Use    Smoking status: Never Smoker    Smokeless tobacco: Never Used   Substance and Sexual Activity    Alcohol use: Not Currently    Drug use: Not Currently    Sexual activity: Not on file   Other Topics Concern    Not  on file   Social History Narrative    Not on file       Family Hx:  No family history on file.    Physical Exam:  24 Hr Vitals Ranges:    BP: (96)/(62)   Temp:  [36.3 C (97.3 F)]   Heart Rate:  [68]   Resp:  [14]   SpO2:  [99 %]      Most recent vitals:    Blood pressure 96/62, pulse 68, temperature 36.3 C (97.3 F), resp. rate 14, SpO2 99 %.     General: alert, appears stated age and cooperative  Chest: unlabored  Heart: normal S1S2  Abdomen: soft, nt, nd  GENITOURINARY: no cvat  Extremities: WWP  Neuro: alert, grossly nonfocal    Labs:      Lab results: 04/20/20  1152 05/06/19  0822   WBC 17.8* 6.9   Hemoglobin 12.5 13.5   Hematocrit 40 40   RBC 4.2 4.4   Platelets 179 227   Neut #  K/uL  --  3.5   Lymph # K/uL  --  2.6   Mono # K/uL  --  0.6   Eos # K/uL  --  0.1   Baso # K/uL  --  0.0   Seg Neut %  --  51.2   Lymphocyte %  --  38.1   Monocyte %  --  8.6   Eosinophil %  --  1.5   Basophil %  --  0.3               Lab results: 04/20/20  1152 05/06/19  0822   Sodium 140 139   Potassium 4.5 3.8   Chloride 107 104   CO2 20 22   UN 11 14   Creatinine 0.69 0.90   GFR,Caucasian 112 83   GFR,Black 129 96   Glucose 109* 104*   Calcium 9.3 9.6             Imaging:  No results found.     Assessment:  Nancy Charles is a 34 y.o. female with a history of nephrolithiasis, recurrent UTIs, and a horseshoe kidney who is presenting as a transfer from [name] due to concern for an infected stone. UA somewhat contaminated at OSH but with positive nitrite. CT showing an obstructing 9 mm L-sided proximal stone in a horseshoe kidney with hydronephrosis.    Plan:  We recommend the following:    - Admit to Dr. [name]  [facility] for Cysto with L ureteral stent placement  - COVID pending  - Continue ceftriaxone  - Analagesia prn  - F/u cultures  - NPO with IVF    Diagnoses for this hospitalization include:  Nephrolithiasis, UTI, Horseshoe kidney    Thank you again for asking the Urology Consult Service to participate in your  patient's care. Please page the Urology Consult resident with any questions related to this patient Urology Consult Pager @ [phone].    Discussed with Dr. Heide Spark, MD  Urology

## 2020-04-20 NOTE — ED Triage Notes (Signed)
See call in note       Triage Note   Ebb Carelock, RN

## 2020-04-20 NOTE — ED Notes (Signed)
04/20/20 0716   Expected Call-In Information   ED Service [facility] Adult Call-in   PCP/Service Referral [phone] CALL. ACCEPTED BY DR. Hector Brunswick, UROLOGY SERVICE   Call received from [facility] Transfer Center? Yes   Pt Info note/Reason for sending SENT FOR INFECTED, OBSTRUCTING STONE.HAS HORSESHOW KIDNEYS COMING FOR A STENT   Pt Coming from [facility]   Temp 36.7 C (98 F)   Pulse 77   Resp 16   BP 102/52   SP02 97   Relevant Medications FENTANYL, ZOFRAN, TORADOL AT 05:15, DILAUDID 0.5MG    Call reported to CHARGE

## 2020-04-20 NOTE — Anesthesia Case Conclusion (Signed)
CASE CONCLUSION  Emergence  Actions:  Suctioned, soft bite block and LMA removed  Criteria Used for Airway Removal:  Adequate Tv & RR, acceptable O2 saturation and following commands  Transport  Directly to: PACU  Airway:  Nasal cannula  Oxygen Delivery:  2 lpm  Position:  Upright  Patient Condition on Handoff  Level of Consciousness:  Mildly sedated  Patient Condition:  Stable  Handoff Report to:  RN

## 2020-04-20 NOTE — ED Notes (Signed)
Pt a transfer from [place] with known obstructing kidnesy stone. Pt endorsingL side flank pain,slight nausea. Pt a/o  X4. ambulatory

## 2020-04-20 NOTE — ED Provider Notes (Addendum)
History     Chief Complaint   Patient presents with    Flank Pain     HPI   Nancy Charles is a 34 y.o. female with past medical history of horseshoe kidney, recurrent UTIs, kidney stones, presents the ED with chief complaint of left flank pain.  Patient transferred from [facility].  Patient woke up this morning with sudden onset left flank pain described as 8 of 10 in severity rating to left flank and down the left groin.  She was evaluated at [facility] general where a UA was concerning for UTI, CT abdomen pelvis demonstrated obstructing stone.  Patient was sent here for concern of infected stone.  Patient was given pain medication, a gram of ceftriaxone, and Zofran.    At this time patient reports her pain is well controlled.  She endorses subjective fevers and chills.  Nausea is well controlled at this time.  She denies any chest pain or trouble breathing.  No changes to her bowel movements. No URI symptoms.     Medical/Surgical/Family History     Past Medical History:   Diagnosis Date    Horseshoe kidney         There is no problem list on file for this patient.           No past surgical history on file.  No family history on file.       Social History     Tobacco Use    Smoking status: Never Smoker    Smokeless tobacco: Never Used   Substance Use Topics    Alcohol use: Not Currently    Drug use: Not Currently     Living Situation     Questions Responses    Patient lives with     Homeless     Caregiver for other family member     External Services     Employment     Domestic Violence Risk                 Review of Systems   Review of Systems   Constitutional: Positive for chills, fatigue and fever. Negative for activity change and appetite change.   HENT: Negative for congestion.    Eyes: Negative for visual disturbance.   Respiratory: Negative for cough, choking, chest tightness and shortness of breath.    Cardiovascular: Negative for chest pain, palpitations and leg swelling.   Gastrointestinal:  Positive for nausea and vomiting. Negative for abdominal pain and diarrhea.   Endocrine: Negative for polyuria.   Genitourinary: Positive for dysuria, flank pain and frequency. Negative for decreased urine volume, difficulty urinating, pelvic pain, urgency, vaginal bleeding, vaginal discharge and vaginal pain.   Musculoskeletal: Negative for back pain, gait problem, joint swelling, neck pain and neck stiffness.   Skin: Negative for wound.   Neurological: Negative for dizziness, seizures, syncope, facial asymmetry, speech difficulty, weakness, light-headedness, numbness and headaches.   Psychiatric/Behavioral: Negative for confusion.       Physical Exam     Triage Vitals  Triage Start: Start, (04/20/20 1017)   First Recorded BP: 96/62, Resp: 14, Temp: 36.3 C (97.3 F) Oxygen Therapy SpO2: 99 %, O2 Device: None (Room air), Heart Rate: 68, (04/20/20 1017)  .      Physical Exam  Vitals and nursing note reviewed.   Constitutional:       General: She is not in acute distress.     Appearance: Normal appearance. She is ill-appearing. She is not toxic-appearing or  diaphoretic.   HENT:      Head: Atraumatic.      Nose: Nose normal.      Mouth/Throat:      Mouth: Mucous membranes are moist.      Pharynx: Oropharynx is clear.   Eyes:      General: No scleral icterus.     Extraocular Movements: Extraocular movements intact.      Conjunctiva/sclera: Conjunctivae normal.      Pupils: Pupils are equal, round, and reactive to light.   Cardiovascular:      Rate and Rhythm: Normal rate and regular rhythm.      Heart sounds: Normal heart sounds. No murmur heard.  No friction rub. No gallop.    Pulmonary:      Effort: Pulmonary effort is normal. No respiratory distress.      Breath sounds: Normal breath sounds.   Abdominal:      General: Abdomen is flat. There is no distension.      Palpations: Abdomen is soft.      Tenderness: There is no abdominal tenderness. There is left CVA tenderness. There is no guarding or rebound.       Comments: No abdominal TTP appreciated. Mild L cva ttp   Musculoskeletal:      Cervical back: Normal range of motion.   Skin:     General: Skin is warm and dry.      Capillary Refill: Capillary refill takes less than 2 seconds.      Coloration: Skin is not jaundiced or pale.   Neurological:      Mental Status: She is alert.      Comments: No gross neurologic deficits          Medical Decision Making     Assessment:    This is a 34 year old female here with concern of infected stone.  Patient appears well initially is no acute distress.  Vitals reassuring.  Exam reassuring.  History concerning for patient with horseshoe kidney and outside hospital work-up demonstrating infected stone.  We will plan to make the patient n.p.o. for potential surgery.  We will repeat basic lab work including CBC, BMP, and UA.  Patient received appropriate antibiosis with a gram of ceftriaxone outside hospital.  We will work on offloading CT abdomen pelvis that she had at [place] [facility] to the hospital.  Plan on consulting urology.  No further interventions indicated at this time.  No signs symptoms to suggest other infection.    Differential diagnosis:    UTI  Nephrolithiasis  Obstructed nephrolithiasis with UTI    Plan:    Orders Placed This Encounter      Bacterial urine culture      CBC and differential      Basic metabolic panel      RUQ panel      Urinalysis with Microscopic UA      COVID-19 PCR      Diet NPO effective now  Dilaudid, Zofran      ED Course and Disposition:    Pt remains well appearing in NAD. Pt admitted to Urology for further management.       ED Course as of 04/20/20 1255   Sun Apr 20, 2020   1145 Spoke with Urology who will come evaluate the patient.    1201 WBC(!): 17.8       Talmage Coin, MD      Resident Attestation:    Patient seen by me on 04/20/2020.    I  saw and evaluated the patient. I agree with the resident's/fellow's findings and plan of care as documented above.  Author:  Aletha Halim, MD       Talmage Coin, MD  Resident  04/20/20 1255       Aletha Halim, MD  04/20/20 1344

## 2020-04-20 NOTE — Op Note (Signed)
Operative Note (Surgical Case/Log ID: [id number])       Date of Surgery: 04/20/2020       Surgeons: Surgeon(s) and Role:     * Hector Brunswick, MD - Primary     * Marcia Brash, MD - Resident - Assisting       Pre-op Diagnosis: Pre-Op Diagnosis Codes:     * Kidney stone on left side [N20.0]       Post-op Diagnosis: Post-Op Diagnosis Codes:     * Kidney stone on left side [N20.0]       Procedure(s) Performed: Procedure(s) (LRB):  CYSTOSCOPY, WITH LEFT URETERAL STENT PLACEMENT (Left)       Anesthesia Type: Anesthesia type not filed in the log.        Fluid Totals: No intake/output data recorded.       Estimated Blood Loss: * No values recorded between 04/20/2020  2:42 PM and 04/20/2020  3:17 PM *       Specimens to Pathology:  ID Type Source Tests Collected by Time Destination   1 : Urine from left kidney URINE Urine (kidney, neph, ureter, stent, cystoscopy, prostatic massage, suprapubic tap) GRAM STAIN, AEROBIC CULTURE Nageldinger, Amy, RN 04/20/2020 1510           Temporary Implants: Stent Ureteral Quadra-Coil 6.33frx22-28; Removal Plan: Having Surgery [facility] 05/05/20; (Anticipated Removal Date:05/05/2020);            Packing:                 Patient Condition: good       Indications: 34/F with horseshoe kidney, left flank pain, WBC 17 and dirty UA currently on antibiotics. Scheduled to undergo URS/LL at [facility] 05/05/2020.        Findings (Including unexpected complications): Non-purulent urine. Horse shoe configuration      Description of Procedure:   Description of Procedure:  The patient was brought into the operating room and underwent general anesthesia without complication.  The patient was placed in dorsal lithotomy position and genitals were prepped and draped in usual sterile fashion.     The patient was given preoperative prophylactic antibiotics = ceftriaxone .     A 78f cystoscope was inserted into the urethra and passed into the bladder.  A sensor wire was passed up to the left moiety kidney.  An open-ended ureteral  catheter was placed past the stone and urine collected for culture. The sensor wire was replaced.     A 6 fr x 22-28 cm multilenght JJ stent without string was placed using flouroscopic guidance.      This completed the procedure, which the patient tolerated well. Anesthesia was reversed and the patient was taken to the recovery room without incidence.  I was scrubbed throughout the procedure.       Signed:  Hector Brunswick, MD  on 04/20/2020 at 3:17 PM

## 2020-04-20 NOTE — INTERIM OP NOTE (Signed)
Interim Op Note (Surgical Log ID: [id number])       Date of Surgery: 04/20/2020       Surgeons: Surgeon(s) and Role:     * Hector Brunswick, MD - Primary     * Marcia Brash, MD - Resident - Assisting   Assistants:         Pre-op Diagnosis: Pre-Op Diagnosis Codes:     * Kidney stone on left side [N20.0]       Post-op Diagnosis: Post-Op Diagnosis Codes:     * Kidney stone on left side [N20.0]       Procedure(s) Performed: Procedure(s) (LRB):  CYSTOSCOPY, WITH LEFT URETERAL STENT PLACEMENT (Left)       Anesthesia Type: Anesthesia type not filed in the log.        Fluid Totals: No intake/output data recorded.       Estimated Blood Loss: * No values recorded between 04/20/2020  2:42 PM and 04/20/2020  3:24 PM *       Specimens to Pathology:  ID Type Source Tests Collected by Time Destination   1 : Urine from left kidney URINE Urine (kidney, neph, ureter, stent, cystoscopy, prostatic massage, suprapubic tap) GRAM STAIN, AEROBIC CULTURE Nageldinger, Amy, RN 04/20/2020 1510           Temporary Implants: Stent Ureteral Quadra-Coil 6.27frx22-28; Removal Plan: Having Surgery [facility] 05/05/20; (Anticipated Removal Date:05/05/2020);            Packing:                 Patient Condition: good       Findings (Including unexpected complications): Uncomplicated cysto with L ureteral stent placement. Horseshoe kidney     Signed:  Marcia Brash, MD  on 04/20/2020 at 3:24 PM

## 2020-04-20 NOTE — H&P (View-Only) (Signed)
UROLOGY CONSULT NOTE    Asked by primary team to evaluate patient for infected nephrolithiasis.    HPI:  Nancy Charles is a 34 y.o. female with a history of nephrolithiasis, recurrent UTIs, and a horseshoe kidney who is presenting as a transfer from [name] due to concern for an infected stone.  According to the consult request, the patient had acute onset L flank pain leading her to present to [facility] general.  There she was scanned and her urine was concerning for infection. She was given a dose of ceftriaxone and transferred. Here, she is afebrile and hemodynamically stable. She states she was recently treated for a UTI.    OSH labs:  W - 8.5  Cr - 0.9  UA - amber, pos nitrite, 250 LE, 51 squam, 32 WBC    Labs [facility]:  W - 17.8    CT (OSH) - "obstructing L kidney stone with hydronephrosis"  ROS:  As in HPI    PMH:    Past Medical History:   Diagnosis Date    Horseshoe kidney        PSH:  No past surgical history on file.    Allergies:   No Known Allergies (drug, envir, food or latex)    Home Meds:  (Not in a hospital admission)      Current Medications:  Current Facility-Administered Medications   Medication Dose Route Frequency    ondansetron (ZOFRAN) injection 4 mg  4 mg Intravenous Q6H PRN    HYDROmorphone PF (DILAUDID) injection 1 mg  1 mg Intravenous Q2H PRN    Lactated Ringers Infusion  125 mL/hr Intravenous Continuous     Current Outpatient Medications   Medication    MELATONIN PO    BIOTIN PO    tamsulosin (FLOMAX) 0.4 MG capsule        Social History:   Social History     Socioeconomic History    Marital status: Single     Spouse name: Not on file    Number of children: Not on file    Years of education: Not on file    Highest education level: Not on file   Tobacco Use    Smoking status: Never Smoker    Smokeless tobacco: Never Used   Substance and Sexual Activity    Alcohol use: Not Currently    Drug use: Not Currently    Sexual activity: Not on file   Other Topics Concern    Not  on file   Social History Narrative    Not on file       Family Hx:  No family history on file.    Physical Exam:  24 Hr Vitals Ranges:    BP: (96)/(62)   Temp:  [36.3 C (97.3 F)]   Heart Rate:  [68]   Resp:  [14]   SpO2:  [99 %]      Most recent vitals:    Blood pressure 96/62, pulse 68, temperature 36.3 C (97.3 F), resp. rate 14, SpO2 99 %.     General: alert, appears stated age and cooperative  Chest: unlabored  Heart: normal S1S2  Abdomen: soft, nt, nd  GENITOURINARY: no cvat  Extremities: WWP  Neuro: alert, grossly nonfocal    Labs:      Lab results: 04/20/20  1152 05/06/19  0822   WBC 17.8* 6.9   Hemoglobin 12.5 13.5   Hematocrit 40 40   RBC 4.2 4.4   Platelets 179 227   Neut #  K/uL  --  3.5   Lymph # K/uL  --  2.6   Mono # K/uL  --  0.6   Eos # K/uL  --  0.1   Baso # K/uL  --  0.0   Seg Neut %  --  51.2   Lymphocyte %  --  38.1   Monocyte %  --  8.6   Eosinophil %  --  1.5   Basophil %  --  0.3               Lab results: 04/20/20  1152 05/06/19  0822   Sodium 140 139   Potassium 4.5 3.8   Chloride 107 104   CO2 20 22   UN 11 14   Creatinine 0.69 0.90   GFR,Caucasian 112 83   GFR,Black 129 96   Glucose 109* 104*   Calcium 9.3 9.6             Imaging:  No results found.     Assessment:  Nancy Charles is a 34 y.o. female with a history of nephrolithiasis, recurrent UTIs, and a horseshoe kidney who is presenting as a transfer from [name] due to concern for an infected stone. UA somewhat contaminated at OSH but with positive nitrite. CT showing an obstructing 9 mm L-sided proximal stone in a horseshoe kidney with hydronephrosis.    Plan:  We recommend the following:    - Admit to Dr. Ajay  - [facility] for Cysto with L ureteral stent placement  - COVID pending  - Continue ceftriaxone  - Analagesia prn  - F/u cultures  - NPO with IVF    Diagnoses for this hospitalization include:  Nephrolithiasis, UTI, Horseshoe kidney    Thank you again for asking the Urology Consult Service to participate in your  patient's care. Please page the Urology Consult resident with any questions related to this patient Urology Consult Pager @ [phone].    Discussed with Dr. Ajay    Chrisopher Pustejovsky, MD  Urology

## 2020-04-20 NOTE — Invasive Procedure Plan of Care (Signed)
[facility]PITAL  [facility] H[facility]PITAL  [facility]    CONSENT FOR MEDICAL  OR SURGICAL PROCEDURE                            Patient Name: Nancy Charles  [facility] [address] MR                                                              DOB: [birth year]         Please read this form or have someone read it to you.   It's important to understand all parts of this form. If something isn't clear, ask [place] to explain.   When you sign it, that means you understand the form and give [place] permission to do this surgery or procedure.     I agree for Hector Brunswick, MD , and Hector Brunswick, MD, Barbaraann Rondo, MD, Allena Katz, MD, Barbaraann Cao, MD, Butler Denmark, MD, Oran Rein, MD, Karle Plumber, DO, Linward Natal, MD, Myrle Sheng, MD, Governor Rooks, MD, Heloise Purpura, MD, Geoffery Spruce, MD, Smiley Houseman, MD, Thomes Dinning, MD, Danny Lawless MD, Michail Jewels MD, Gwendlyn Deutscher, MD, Lollie Marrow, MD, Tammi Klippel, MD, Collene Schlichter, MD, Drinda Butts Sessions, MD, Theresia Majors, MD, Wilfred Curtis MD, Winfield Cunas, MD, Marquette Old, MD,    along with any assistants* they may choose, to treat the following condition(s): Kidney stone   By doing this surgery or procedure on me: Placement of a lighted telescope into the bladder through the urethra with stent placement   This is also known as: Cystoscopy with ureteral stent placement   Laterality: Left     *if you'd like a list of the assistants, please ask. We can give that to you.    1. The care provider has explained my condition to me. They have told me how the procedure can help me. They have told me about other ways of treating my condition. I understand the care provider cannot guarantee the result of the procedure. If I don't have this procedure, my other choices are: No surgery, nephrostomy tube    2. The care provider has told me the risks (problems that can happen) of the procedure. I understand there may be unwanted results. The risks that are related to this  procedure include: Infection; bleeding; damage to urethra, prostate (for males), bladder, ureters or kidney; urinary retention; pain or burning on urination; damage to surrounding structures; side effects from anesthesia    3. I understand that during the procedure, my care provider may find a condition that we didn't know about before the treatment started. Therefore, I agree that my care provider can perform any other treatment which they think is necessary and available.    4. I understand the care provider may remove tissue, body parts, or materials during this procedure. These materials may be used to help with my diagnosis and treatment. They might also be used for teaching purposes or for research studies that I have separately agreed to participate in. Otherwise they will be disposed of as required by law.    5. My care provider might want a representative from a medical device company to be there during my procedure. I understand that person works for:  The ways they might help my care provider during my procedure include:            6. Here are my decisions about receiving blood, blood products, or tissues. I understand my decisions cover the time before, during and after my procedure, my treatment, and my time in the hospital. After my procedure, if my condition changes a lot, my care provider will talk with me again about receiving blood or blood products. At that time, my care provider might need me to review and sign another consent form, about getting or refusing blood.    I understand that the blood is from the community blood supply. Volunteers donated the blood, the volunteers were screened for health problems. The blood was examined with very sensitive and accurate tests to look for hepatitis, HIV/AIDS, and other diseases. Before I receive blood, it is tested again to make sure it is the correct type.    My chances of getting a sickness from blood products are small. But no transfusion is  100% safe. I understand that my care provider feels the good I will receive from the blood is greater than the chances of something going wrong. My care provider has answered my questions about blood products.      My decision  about blood or  blood products           My decision   about tissue  Implants              I understand this  form.    My care provider  or his/her  assistants have  explained:   What I am having done and why I need it.  What other choices I can make instead of having this done.  The benefits and possible risks (problems) to me of having this done.  The benefits and possible risks (problems) to me of receiving transplants, blood, or blood products.  There is no guarantee of the results.  The care provider may not stay with me the entire time that I am in the operating or procedure room.  My provider has explained how this may affect my procedure. My provider has answered my  questions about this.         I give my  permission for  this surgery or  procedure.            _______________________________________________                                     My signature  (or parent or other person authorized to sign for you, if you are unable to sign for  yourself or if you are under 30 years old)        ______           Date        _____        Time   Electronic Signatures will display at the bottom of the consent form.    Care provider's statement: I have discussed the planned procedure, including the possibility for transfusion of blood  products or receipt of tissue as necessary; expected benefits; the possible complications and risks; and possible alternatives  and their benefits and risks with the patients or the patient's surrogate. In my opinion, the patient or the patient's surrogate  understands the proposed procedure, its risks, benefits and alternatives.  Electronically signed by: Marcia Brash, MD                                                04/20/2020         Date         12:31 PM        Time

## 2020-04-20 NOTE — Progress Notes (Signed)
Urology Progress Note    Interval History/Subjective:  NAEO. AFVSS  Pain adequately controlled. Some mild stent discomfort, mild burning with urination.  - flatus, - BM.   Adequate UOP.   Tolerating diet without N/V.   Ambulating without difficulty.   Denies fever, chills, chest pain, SOB, dizziness.   Gram stain negative      Medications:   cefTRIAXone  1,000 mg Intravenous Q24H    tamsulosin  0.4 mg Oral QPM       Objective:  Temp (24hrs), Avg:35.9 C (96.7 F), Min:35.6 C (96.1 F), Max:36.3 C (97.3 F)    Blood pressure 99/61, pulse 59, temperature 36 C (96.8 F), temperature source Temporal, resp. rate 16, height 1.6 m (5' 2.99"), weight 65.8 kg (145 lb), SpO2 99 %.  Date 04/20/20 0700 - 04/21/20 0659 04/21/20 0700 - 04/22/20 0659   Shift 0700-1459 1500-2259 2300-0659 24 Hour Total 0700-1459 1500-2259 2300-0659 24 Hour Total   INTAKE   P.O.  300 300 600         P.O.  300 300 600       I.V.  1171.4(2.2)  1171.4         Volume (mL) (Lactated Ringers Infusion)  1171.4  1171.4       Shift Total(mL/kg)  1471.4(22.4) 300(4.6) 1771.4(26.9)       OUTPUT   Urine  200(0.4) 150 350         Urine  200 150 350       Emesis/NG output             Emesis Occurrence  0 x 0 x 0 x       Stool             Stool Occurrence  0 x 0 x 0 x       Blood  5  5         Blood Loss  5  5       Shift Total(mL/kg)  205(3.1) 150(2.3) 355(5.4)       NET  1266.4 150 1416.4       Weight (kg)  65.8 65.8 65.8 65.8 65.8 65.8 65.8     I/O last 3 completed shifts:  09/04 2300 - 09/05 2259  In: 1471.4 (22.4 mL/kg) [P.O.:300; I.V.:1171.4 (0.7 mL/kg/hr)]  Out: 205 (3.1 mL/kg) [Urine:200 (0.1 mL/kg/hr); Blood:5]  Net: 1266.4  Weight: 65.8 kg     EXAM:  Vitals: BP 99/61 (BP Location: Left arm)    Pulse 59    Temp 36 C (96.8 F) (Temporal)    Resp 16    Ht 1.6 m (5' 2.99")    Wt 65.8 kg (145 lb)    SpO2 99%    BMI 25.69 kg/m    Pain Score:      GEN: NAD   PULM: nonlabored respirations, RR   CV: RR, no pedal edema   ABD: soft, ATTP, ND  GU: No CVA  tenderness  NEURO/MOTOR: alert and oriented, no focal deficits  EXTREMITIES: warm, well perfused      Labs:  Heme:  Recent Labs   Lab 04/20/20  2315 04/20/20  1152 04/20/20  1152   WBC 11.8*  --  17.8*   Hemoglobin 11.0*   < > 12.5   Hematocrit 35  --  40   Platelets 166  --  179    < > = values in this interval not displayed.     Chem:  Recent Labs   Lab  04/20/20  2315 04/20/20  1152   Sodium 136 140   Potassium 4.4 4.5   Chloride 105 107   CO2 23 20   UN 12 11   Creatinine 0.71 0.69   Glucose 149* 109*   Calcium 9.0 9.3   No results for input(s): PGLU in the last 168 hours.  Cultures:  Aerobic Culture   Date Value Ref Range Status   04/20/2020 .  Preliminary   04/20/2020 .  Preliminary       Imaging:  No results found.      Assessment:  Nancy Charles is a 34 y.o. female with a history of nephrolithiasis, recurrent UTIs, and a horseshoe kidney who is presenting as a transfer from [name] due to concern for an infected stone. UA somewhat contaminated at OSH but with positive nitrite. CT showing an obstructing 9 mm L-sided proximal stone in a horseshoe kidney with hydronephrosis now s/p L stent on 04/20/20.    Plan:   - Abx: CTX. To send home with oral antibiotics  - Analagesia prn  - F/u cultures  - Diet: Regular  - Stent medications: flomax, pyridium, ditropan  - Definitive stone treatment in 2-4 weeks    Dispo: Anticipated discharge today    Diagnoses for this hospitalization include:  Nephrolithiasis, UTI, Horseshoe kidney    [facility] Toma Aran, MD  Urology

## 2020-04-20 NOTE — Anesthesia Preprocedure Evaluation (Addendum)
Anesthesia Pre-operative History and Physical for Nancy Charles    Highlighted Issues for this Procedure:  34 y.o. female with Kidney stone on left side (N20.0) presenting for Procedure(s) (LRB):  CYSTOSCOPY, WITH LEFT URETERAL STENT PLACEMENT (Left) by Surgeon(s):  Hector Brunswick, MD scheduled for 60 minutes.  BMI Readings from Last 1 Encounters:  05/06/19 : 24.80 kg/m    Allergies:  NKDA    PMHx:  Horseshoe kidney, recurrent UTIs, kidney stones    Anesthetic history:  No records available for review    Urine Pregnancy Test:  neg        .  Marland Kitchen  Anesthesia Evaluation Information Source: patient, records     ANESTHESIA HISTORY  Pertinent(-):  No History of anesthetic complications or Family hx of anesthetic complications    GENERAL     Denies general issues  Pertinent (-):  No obesity, history of anesthetic complications or Family Hx of Anesthetic Complications    HEENT     Denies HEENT issues PULMONARY    + Smoker          THC currently  Pertinent(-):  No asthma, snoring or COPD    CARDIOVASCULAR  Good(4+METs) Exercise Tolerance  Pertinent(-):  No hypertension, past MI, CAD, dysrhythmias or CHF    GI/HEPATIC/RENAL    + Renal Issues (horseshoe kidney, recurrent UTIs, nephrolithiasis)  Pertinent(-):  No GERD or liver  issues  NEURO/PSYCH  Pertinent(-):  No seizures or cerebrovascular event    ENDO/OTHER  Pertinent(-):  No diabetes mellitus, thyroid disease    HEMATOLOGIC     Denies hematologic issues         Physical Exam    Airway            Mouth opening: normal            Mallampati: I            Neck ROM: full  Dental        Cardiovascular           Rhythm: regular           Rate: normal  No friction rub or murmur      Neurologic    No sensory deficit and motor deficit     Pulmonary     breath sounds clear to auscultation    No cough, rhonchi, decreased breath sounds    Mental Status     oriented to person, place and time         ________________________________________________________________________  PLAN  ASA  Score  2  Anesthetic Plan general     Induction (routine IV) General Anesthesia/Sedation Maintenance Plan (inhaled agents and IV bolus);  Airway Manipulation (none); Airway (LMA); Line ( use current access); Monitoring (standard ASA); Positioning (lithotomy); PONV Plan (dexamethasone and ondansetron); Pain (per surgical team); PostOp (PACU)    Informed Consent     Risks:         Risks discussed were commensurate with the plan listed above with the following specific points: N/V, aspiration, sore throat, hypotension, emergence delirium, dizziness, unsteadiness and fatigue, Damage to: eyes, nerves and teeth, allergic Rx, unexpected serious injury, awareness and death.    Anesthetic Consent:         Anesthetic plan (and risks as noted above) were discussed with patient    Plan also discussed with team members including:       attending, resident and surgeon    Responsible Anesthesia Attestation:  I attest that the patient or proxy  understands and accepts the risks and benefits of the anesthesia plan. I also attest that I have personally performed a pre-anesthetic examination and evaluation, and prescribed the anesthetic plan for this particular location within 48 hours prior to the anesthetic as documented. Elizebeth Brooking Katriel Cutsforth, MBBCh  04/20/20, 4:10 PM

## 2020-04-20 NOTE — Anesthesia Procedure Notes (Signed)
---------------------------------------------------------------------------------------------------------------------------------------    AIRWAY   GENERAL INFORMATION AND STAFF    Patient location during procedure: OR       Date of Procedure: 04/20/2020 3:05 PM  CONDITION PRIOR TO MANIPULATION     Current Airway/Neck Condition:  Normal        For more airway physical exam details, see Anesthesia PreOp Evaluation  AIRWAY METHOD     Patient Position:  Sniffing    Preoxygenated: yes      Induction: IV  Mask Difficulty Assessment:  0 - not attempted    Number of Attempts at Approach:  1    Number of Other Approaches Attempted:  0  FINAL AIRWAY DETAILS    Final Airway Type:  LMA    Final LMA: Unique    LMA Size: 4  ----------------------------------------------------------------------------------------------------------------------------------------

## 2020-04-20 NOTE — Progress Notes (Signed)
04/20/20 1700   Vital Signs   Temp 35.8 C (96.4 F)   Heart Rate 71   Heart Rate Source Automated/oximeter   Resp 16   BP 112/64   BP Method Automatic   BP Location Right arm   Patient Position Sitting   Oxygen Therapy   SpO2 96 %   Oximetry Source Rt Hand   O2 Device None (Room air)   Current Pain Assessment   Pain Assessment / Reassessment Assessment   Pain Scale 0-10 (Numeric Scale for Pain Intensity)    0-10 Scale 4   Pain Location/Orientation Groin   Pain Descriptors Sharp   No Intervention/MAR Intervention(s) Patient declined; No Intervention required      Pt arrived to floor at above time. Pt able to ambulate from stretcher to bed with no difficulty. AAOx3, VSS. Pt oriented to floor and callbell system, all questions answred at this time. Will continue to monitor.     4-Eyed Skin Check        Nancy Charles is a 34 y.o. year old female, admitted/transferred to unit on 9/5 at 1725.    []  New Admission  [x]  Transfer: Unit PACU    4 Eyed Skin Assessment:  Skin Breakdown Present: Yes []   No [x]        Complete 4 Eyed Skin Check Completed with: , RN     , RN

## 2020-04-21 ENCOUNTER — Encounter: Payer: Self-pay | Admitting: Urology

## 2020-04-21 ENCOUNTER — Other Ambulatory Visit: Payer: Self-pay

## 2020-04-21 LAB — CBC AND DIFFERENTIAL
Baso # K/uL: 0 10*3/uL (ref 0.0–0.1)
Basophil %: 0.3 %
Eos # K/uL: 0.1 10*3/uL (ref 0.0–0.4)
Eosinophil %: 0.8 %
Hematocrit: 35 % (ref 34–45)
Hemoglobin: 11.1 g/dL — ABNORMAL LOW (ref 11.2–15.7)
IMM Granulocytes #: 0 10*3/uL (ref 0.0–0.0)
IMM Granulocytes: 0.4 %
Lymph # K/uL: 2.6 10*3/uL (ref 1.2–3.7)
Lymphocyte %: 25.9 %
MCH: 30 pg (ref 26–32)
MCHC: 32 g/dL (ref 32–36)
MCV: 95 fL (ref 79–95)
Mono # K/uL: 0.9 10*3/uL (ref 0.2–0.9)
Monocyte %: 8.6 %
Neut # K/uL: 6.5 10*3/uL — ABNORMAL HIGH (ref 1.6–6.1)
Nucl RBC # K/uL: 0 10*3/uL (ref 0.0–0.0)
Nucl RBC %: 0 /100 WBC (ref 0.0–0.2)
Platelets: 178 10*3/uL (ref 160–370)
RBC: 3.7 MIL/uL — ABNORMAL LOW (ref 3.9–5.2)
RDW: 13 % (ref 11.7–14.4)
Seg Neut %: 64 %
WBC: 10.2 10*3/uL — ABNORMAL HIGH (ref 4.0–10.0)

## 2020-04-21 LAB — AEROBIC CULTURE
Aerobic Culture: 0
Aerobic Culture: 0

## 2020-04-21 LAB — BASIC METABOLIC PANEL
Anion Gap: 8 (ref 7–16)
CO2: 23 mmol/L (ref 20–28)
Calcium: 9 mg/dL (ref 8.8–10.2)
Chloride: 105 mmol/L (ref 96–108)
Creatinine: 0.71 mg/dL (ref 0.51–0.95)
GFR,Black: 126 *
GFR,Caucasian: 110 *
Glucose: 149 mg/dL — ABNORMAL HIGH (ref 60–99)
Lab: 12 mg/dL (ref 6–20)
Potassium: 4.4 mmol/L (ref 3.3–5.1)
Sodium: 136 mmol/L (ref 133–145)

## 2020-04-21 MED ORDER — PHENAZOPYRIDINE HCL 100 MG PO TABS *I*
100.0000 mg | ORAL_TABLET | Freq: Three times a day (TID) | ORAL | Status: DC | PRN
Start: 2020-04-21 — End: 2020-04-22
  Administered 2020-04-21 (×2): 100 mg via ORAL
  Filled 2020-04-21 (×2): qty 1

## 2020-04-21 MED ORDER — SULFAMETHOXAZOLE-TRIMETHOPRIM 800-160 MG PO TABS *I*
1.0000 | ORAL_TABLET | Freq: Two times a day (BID) | ORAL | 0 refills | Status: DC
Start: 2020-04-21 — End: 2020-04-22
  Filled 2020-04-21: qty 12, 6d supply, fill #0

## 2020-04-21 NOTE — Interdisciplinary Rounds (Signed)
Interdisciplinary Discharge Communication Note        Targeted Discharge Disposition : Home    Rounding was performed on: Date: 04/21/20 at:      Interdisciplinary Rounding Participants:      Admit Date/Time:  04/20/2020 11:21 AM     Principal Problem:  <principal problem not specified> cysto with l ureteral stent placement   The patient's problem list and interdisciplinary care plan was reviewed.      Expected Date for Discharge:    Plan for d/c today.       Discharge Planning    Targeted Disposition   Targeted Discharge Disposition : Home    Home Care  Home Care, Choiced?: N/A      Medical Equipment Needs  Current Home Equipment: None Respiratory Home Equipment : Not Applicable Medical Supplies Available: Not Applicable         Delivery of D/C DME/medical supplies confirmed, if applicable: Not Applicable      Communication      9/6 Plan for d/c today.           Current PCP:  Aaron Mose, MD    Scheduled Future Appointments:            Transportation               Prescriptions             Marcy Salvo, RN  7:31 AM

## 2020-04-21 NOTE — Discharge Summary (Deleted)
Name: Nancy Charles MRN: [id number] DOB: 1986/05/23     Admit Date: 04/20/2020   Date of Discharge: 04/21/2020     Patient was accepted for discharge to              Discharge Attending Physician: Hector Brunswick, MD      Hospitalization Summary    CONCISE NARRATIVE: Patient is a 34 year old female with history of recurrent UTIs past stones and a horseshoe kidney presenting as a transfer from [facility] due to an obstructing proximal left ureteral stone.  There was concern for infection so she was taken to the OR and stented on the left side.  She tolerated the stent well.  She remained afebrile and hemodynamically stable.  [facility] was contacted the next day and we were informed she was growing Proteus in her urine.  She tolerated the ceftriaxone well and was transitioned to Bactrim for home.  She will follow up with her outside urologist [name], MD for ureteroscopy.        OR PROCEDURE: Cysto with L ureteral stent            SIGNIFICANT MED CHANGES: Yes  Bactrim for 6 more days       Signed: Marcia Brash, MD  On: 04/21/2020  at: 8:35 AM

## 2020-04-21 NOTE — Plan of Care (Signed)
Problem: Safety  Goal: Patient will remain free of falls  Outcome: Progressing towards goal  Goal: Prevent any intentional injury  Outcome: Maintaining    Problem: Pain/Comfort  Goal: Patient’s pain or discomfort is manageable  Outcome: Progressing towards goal    Problem: Nutrition  Goal: Patient’s nutritional status is maintained or improved  Outcome: Progressing towards goal    Problem: Mobility  Goal: Patient’s functional status is maintained or improved  Outcome: Progressing towards goal    Problem: Psychosocial  Goal: Demonstrates ability to cope with illness  Outcome: Progressing towards goal    Problem: Cognitive function  Goal: Cognitive function will be maintained or return to baseline  Outcome: Progressing towards goal

## 2020-04-22 ENCOUNTER — Other Ambulatory Visit: Payer: Self-pay

## 2020-04-22 ENCOUNTER — Encounter: Payer: Self-pay | Admitting: Urology

## 2020-04-22 LAB — BASIC METABOLIC PANEL
Anion Gap: 11 (ref 7–16)
CO2: 25 mmol/L (ref 20–28)
Calcium: 9 mg/dL (ref 8.8–10.2)
Chloride: 102 mmol/L (ref 96–108)
Creatinine: 0.92 mg/dL (ref 0.51–0.95)
GFR,Black: 92 *
GFR,Caucasian: 80 *
Glucose: 98 mg/dL (ref 60–99)
Lab: 19 mg/dL (ref 6–20)
Potassium: 4.8 mmol/L (ref 3.3–5.1)
Sodium: 138 mmol/L (ref 133–145)

## 2020-04-22 MED ORDER — CEFPODOXIME PROXETIL 200 MG PO TABS *I*
200.0000 mg | ORAL_TABLET | Freq: Two times a day (BID) | ORAL | Status: DC
Start: 2020-04-22 — End: 2020-04-22
  Administered 2020-04-22: 200 mg via ORAL
  Filled 2020-04-22: qty 1

## 2020-04-22 MED ORDER — CEFPODOXIME PROXETIL 200 MG PO TABS *I*
200.0000 mg | ORAL_TABLET | Freq: Two times a day (BID) | ORAL | Status: DC
Start: 2020-04-22 — End: 2020-04-22

## 2020-04-22 MED ORDER — CEFPODOXIME PROXETIL 200 MG PO TABS *I*
200.0000 mg | ORAL_TABLET | Freq: Two times a day (BID) | ORAL | 0 refills | Status: DC
Start: 2020-04-22 — End: 2020-04-22
  Filled 2020-04-22: qty 26, 13d supply, fill #0

## 2020-04-22 MED ORDER — CEFPODOXIME PROXETIL 200 MG PO TABS *I*
200.0000 mg | ORAL_TABLET | Freq: Two times a day (BID) | ORAL | 0 refills | Status: AC
Start: 2020-04-22 — End: 2020-05-06
  Filled 2020-04-22: qty 28, 14d supply, fill #0

## 2020-04-22 MED ORDER — SULFAMETHOXAZOLE-TRIMETHOPRIM 800-160 MG PO TABS *I*
1.0000 | ORAL_TABLET | Freq: Two times a day (BID) | ORAL | Status: DC
Start: 2020-04-22 — End: 2020-04-22
  Filled 2020-04-22: qty 1

## 2020-04-22 MED ORDER — SULFAMETHOXAZOLE-TRIMETHOPRIM 800-160 MG PO TABS *I*
1.0000 | ORAL_TABLET | Freq: Two times a day (BID) | ORAL | 0 refills | Status: DC
Start: 2020-04-22 — End: 2020-04-22
  Filled 2020-04-22: qty 14, 7d supply, fill #0

## 2020-04-22 NOTE — Discharge Summary (Signed)
Name: Almeta Geisel MRN: [id number] DOB: Feb 26, 1986     Admit Date: 04/20/2020   Date of Discharge: 04/22/2020     Patient was accepted for discharge to   Home or Self Care [1]           Discharge Attending Physician: Hector Brunswick, MD      Hospitalization Summary    CONCISE NARRATIVE: Patient is a 34 year old female with history of recurrent UTIs past stones and a horseshoe kidney presenting as a transfer from [facility] due to an obstructing proximal left ureteral stone.  There was concern for infection so she was taken to the OR and stented on the left side.  She tolerated the stent well.  She remained afebrile and hemodynamically stable.  [facility] was contacted the next day and we were informed she was growing Proteus in her urine.  She tolerated the ceftriaxone well and was transitioned to Bactrim for home.  She will follow up with her outside urologist [name], MD for ureteroscopy.        OR PROCEDURE: Cysto with L ureteral stent            SIGNIFICANT MED CHANGES: Yes  Vantin until 05/05/20       Signed: Rolena Infante, NP  On: 04/22/2020  at: 8:06 AM

## 2020-04-22 NOTE — Discharge Instructions (Signed)
[facility] PATIENT DISCHARGE PLAN      DATE: 04/20/2020 Procedure: cystoscopy with Left ureteral stent placement    Physician: Hector Brunswick, MD      FOR AMBULATORY SURGICAL PATIENTS:  You have received sedative medication and/or general anesthesia which may make you drowsy for as long as 24 hours.     A.) DO NOT drive or operate any machinery for 24 hours    B.) DO NOT drink alcoholic beverages for 24 hours    C.) DO NOT make major decisions, sign contracts, etc. for 24 hours    DIET: Resume your previous diet    ACTIVITY: No restrictions and Rest today    CARE OF DRESSING OR INCISION: N/A    BATHING/SHOWERING: No restrictions    OTHER: Your stent will remain in place until your procedure with Paoli Broom, MD    PAIN MANAGEMENT RECOMMENDATION: Assess your level of pain on a scale from 0 to 10.    Take tylenol and/or ibuprofen as needed for pain management.    You should take narcotic pain medication if your pain level is greater than three (3) - be sure to eat prior to taking the medication. If your physician has prescribed non-narcotic pain medication, please take as directed.    Oxybutynin (Ditropan)- for bladder spasms, which feel like cramps.  Or if you have pain not relieved with the pain medication- you can try this medication. If you  leak urine at the end of your urethra you could be having a spasm and not feeling it- try this medication to see if the leaking stops.  Pyridium-for burning with urination, this will turn your urine orange and stain if the urine is spilled.     FOLLOW-UP CARE  Follow-up with [place] Broom, MD    Call your doctor for the following problems: FEVER over 101 F/38.4 C; PAIN not relieved with pain medication ordered; SWELLING, REDNESS at incision site, heavy BLEEDING, foul DRAINAGE, INABILITY TO URINATE.     If there is a problem call Hector Brunswick, MD         ***once you notice the bacterial culture and sensitivities are resulted please have them faxed to [phone]**  your antibiotic may need to be changed  [phone] to reach provider to change antibotic    If you have difficulty contacting the doctor call [facility] at [phone], weekdays between 8:00 AM and 11:00 PM. At other times, call the Emergency Department at [phone] 24 hours a day.

## 2020-04-22 NOTE — Progress Notes (Signed)
Urology Progress Note    Interval History/Subjective:  NAEO. AVSS  Pain adequately controlled, having intermittent stent discomfort.   + flatus, - BM.   Adequate UOP.   No f/c/n/v  Ambulating  Denies fever, chills, chest pain, SOB, dizziness.   Gram stain negative, aerobic culture no growth      Medications:   cefTRIAXone  1,000 mg Intravenous Q24H    tamsulosin  0.4 mg Oral QPM       Objective:  Temp (24hrs), Avg:36.3 C (97.3 F), Min:36.1 C (97 F), Max:36.5 C (97.7 F)    Blood pressure 98/64, pulse 76, temperature 36.1 C (97 F), temperature source Temporal, resp. rate 16, height 1.6 m (5' 2.99"), weight 65.8 kg (145 lb), SpO2 96 %.  Date 04/21/20 0700 - 04/22/20 0659 04/22/20 0700 - 04/23/20 0659   Shift 0700-1459 1500-2259 2300-0659 24 Hour Total 0700-1459 1500-2259 2300-0659 24 Hour Total   INTAKE   P.O. 540 [phone]         P.O. 540 [phone]       Shift Total(mL/kg) 540(8.2) 600(9.1) 360(5.5) 1500(22.8)       OUTPUT   Urine(mL/kg/hr) 200(0.4) 450(0.9) 350 1000         Urine 200 [phone]       Emesis/NG output             Emesis Occurrence  0 x 0 x 0 x       Stool             Stool Occurrence  0 x 0 x 0 x       Shift Total(mL/kg) 200(3) 450(6.8) 350(5.3) 1000(15.2)       NET 340 150 10 500       Weight (kg) 65.8 65.8 65.8 65.8 65.8 65.8 65.8 65.8     I/O last 3 completed shifts:  09/05 2300 - 09/06 2259  In: 1440 (21.9 mL/kg) [P.O.:1440]  Out: 800 (12.2 mL/kg) [Urine:800 (0.5 mL/kg/hr)]  Net: 640  Weight: 65.8 kg     EXAM:  Vitals: BP 98/64 (BP Location: Left arm)    Pulse 76    Temp 36.1 C (97 F) (Temporal)    Resp 16    Ht 1.6 m (5' 2.99")    Wt 65.8 kg (145 lb)    SpO2 96%    BMI 25.69 kg/m    Pain Score:      GEN: NAD   PULM: nonlabored respirations, RR   CV: RR, no pedal edema   ABD: soft, NT, ND  GU: No CVA tenderness  NEURO/MOTOR: alert and oriented, no focal deficits  EXTREMITIES: warm, well perfused      Labs:  Heme:  Recent Labs   Lab 04/21/20  2325 04/20/20  2315 04/20/20  2315  04/20/20  1152 04/20/20  1152   WBC 10.2*  --  11.8*  --  17.8*   Hemoglobin 11.1*   < > 11.0*   < > 12.5   Hematocrit 35  --  35  --  40   Platelets 178  --  166  --  179    < > = values in this interval not displayed.     Chem:  Recent Labs   Lab 04/21/20  2325 04/20/20  2315 04/20/20  1152   Sodium 138 136 140   Potassium 4.8 4.4 4.5   Chloride 102 105 107   CO2 25 23 20    UN 19 12 11  Creatinine 0.92 0.71 0.69   Glucose 98 149* 109*   Calcium 9.0 9.0 9.3   No results for input(s): PGLU in the last 168 hours.  Cultures:  Aerobic Culture   Date Value Ref Range Status   04/20/2020 .  Final   04/20/2020 .  Final       Imaging:  No results found.      Assessment:  Nancy Charles is a 34 y.o. female with a history of nephrolithiasis, recurrent UTIs, and a horseshoe kidney who is presenting as a transfer from [name] due to concern for an infected stone. UA somewhat contaminated at OSH but with positive nitrite. CT showing an obstructing 9 mm L-sided proximal stone in a horseshoe kidney with hydronephrosis now s/p L stent on 04/20/20.    Plan:   - Abx: CTX, no growth here but growing proteus at OSH  - will call [facility] for sensitives from proteus growing there.  - Analagesia prn  - F/u cultures  - Diet: Regular  - Stent medications: flomax, pyridium, ditropan  - Definitive stone treatment in 2-4 weeks with Nancy Charles    Dispo: Anticipated discharge today    Diagnoses for this hospitalization include:  Nephrolithiasis, UTI, Horseshoe kidney    Nancy Charles Nancy Bo, MD  Urology

## 2020-04-22 NOTE — Progress Notes (Signed)
Writer's contact with patient was via face-to-face PPE used: mask, gloves, and eye protection.   Was patient masked during visit: yes.     SW met with pt and provided her with one parking pass for discharge. Family educated on how to obtain parking pass. Will continue to plan for safe discharge and assist as needed.

## 2020-04-22 NOTE — Interdisciplinary Rounds (Signed)
Interdisciplinary Discharge Communication Note    Medically Ready for Discharge Date: 04/22/20   Targeted Discharge Disposition : Home    Rounding was performed on: Date: 04/22/20 at: Time: 1000    Interdisciplinary Rounding Participants: APP, SW, Care Coordinator, Consulting civil engineer, Engineer, drilling    Admit Date/Time:  04/20/2020 11:21 AM     Principal Problem:  <principal problem not specified>  The patient's problem list and interdisciplinary care plan was reviewed.    Expected Date for Discharge: 04/22/2020        Discharge Planning: Home today with no home needs.    Targeted Disposition   Targeted Discharge Disposition : Home    Home Care  Home Care, Choiced?: N/A      Medical Equipment Needs  Current Home Equipment: None Respiratory Home Equipment : Not Applicable Medical Supplies Available: Not Applicable Equipment/Supplies Ordered: None       Delivery of D/C DME/medical supplies confirmed, if applicable: Not Applicable      Communication  Is communication necessary with patient spokesperson? : No  Why communication is not necessary: Patient has capacity          Current PCP:  Aaron Mose, MD    Scheduled Future Appointments:  Follow-up Appointments: Other follow-up appointmentsOther Follow-Up Appointments: Dr [name]        Transportation  Transportation Setup: Complete       SW Assistance for Transportation: N/A    Prescriptions   Prescriptions sent to outpatient pharmacy: Complete         Kermit Balo, RN  8:03 AM

## 2020-04-22 NOTE — Progress Notes (Addendum)
04/22/20 0848   [facility] Patient Class Review   Patient Class Review Observation     Patient class effective as of 04/20/2020.    Patient discharged prior to notification of observation level of care.    Nena Polio, RN  Utilization Management  [phone]

## 2020-04-23 NOTE — Progress Notes (Signed)
04/23/20 0950   Post-Discharge Phone Call   Was 24 hour post-discharge phone call completed? Yes   Phone Call Comments Left a message and phone # in case of concerns.

## 2020-05-14 ENCOUNTER — Telehealth: Payer: Self-pay | Admitting: Urology

## 2020-05-14 NOTE — Telephone Encounter (Signed)
Copied from CRM #[id number]. Topic: Information Request - Contact/Fax Information  >> May 14, 2020  3:46 PM [name] wrote:  Nancy Charles is inquiring if [employer] Insurance Form was received for Dr. [name] to fill out. She reports she faxed this form on 04/29/2020 to [phone]. She states Dr. [name] was part of her surgery at [facility].     Best number to reach patient [phone]

## 2020-05-15 NOTE — Telephone Encounter (Signed)
Filled out , mailed to patient informed patient , sent copy to scanning

## 2021-03-31 ENCOUNTER — Other Ambulatory Visit: Payer: Self-pay

## 2021-04-17 LAB — UNMAPPED LAB RESULTS
Basophil # (HT): 0 10 3/uL (ref 0.0–0.1)
Basophil % (HT): 0 % (ref 0–3)
Eosinophil # (HT): 0.1 10 3/uL (ref 0.0–0.8)
Eosinophil % (HT): 1 % (ref 0–5)
Hematocrit (HT): 38 % (ref 38–48)
Hemoglobin (HGB) (HT): 12.9 g/dL (ref 11.9–15.9)
Lymphocyte # (HT): 1.9 10 3/uL (ref 0.6–3.3)
Lymphocyte % (HT): 27 % (ref 15–45)
MCHC (HT): 33.8 g/dL (ref 31.1–34.5)
MCV (HT): 91 fL (ref 80–97)
Mean Corpuscular Hemoglobin (MCH) (HT): 30.6 pg (ref 26.6–30.6)
Mean Platelet Volume (HT): 10.6 fL (ref 9.0–12.2)
Monocyte # (HT): 0.6 10 3/uL (ref 0.1–1.1)
Monocyte % (HT): 9 % (ref 0–15)
Neutrophil # (HT): 4.5 10 3/uL (ref 2.0–7.1)
Platelets (HT): 251 10 3/uL (ref 142–414)
RBC (HT): 4.22 10 6/uL (ref 4.04–5.48)
RDW (HT): 13 % (ref 12.9–16.3)
Seg Neut % (HT): 63 % (ref 45–75)
WBC (HT): 7.2 10 3/uL (ref 4.8–10.4)

## 2021-06-23 ENCOUNTER — Telehealth: Payer: Self-pay

## 2021-06-23 NOTE — Telephone Encounter (Signed)
Not a [place] pt

## 2022-07-16 ENCOUNTER — Encounter: Payer: Self-pay | Admitting: Gastroenterology

## 2022-07-16 NOTE — Progress Notes (Signed)
Received referral. Faxed to [facility]

## 2023-07-25 ENCOUNTER — Encounter: Payer: Self-pay | Admitting: Gastroenterology

## 2023-07-26 NOTE — Progress Notes (Signed)
Reviewed referral for swollen, red, painful fingers in cold - sent to referral [facility] to schedule pt for upper segs and NPV in arterial

## 2023-08-03 ENCOUNTER — Other Ambulatory Visit: Payer: Self-pay | Admitting: Adult Health

## 2023-08-03 LAB — CBC AND DIFFERENTIAL
Baso # K/uL: 0 10*3/uL (ref 0.0–0.2)
Basophil %: 1 % (ref 0–2)
Eos # K/uL: 0.1 10*3/uL (ref 0.0–0.7)
Eosinophil %: 2 % (ref 0–8)
Hematocrit: 42 % (ref 37.5–47.7)
Hemoglobin: 13.8 g/dL (ref 12.1–15.8)
Immature Granulocytes Absolute: 0.02 10*3/uL (ref 0.0–0.031)
Immature Granulocytes: 0.3 % (ref 0.0–0.4)
Lymph # K/uL: 2 10*3/uL (ref 1.0–5.2)
Lymphocyte %: 31 % (ref 13–42)
MCH: 30.7 pg (ref 26.4–33.6)
MCHC: 32.9 g/dL (ref 31.9–37.3)
MCV: 93 fL (ref 80–93)
Mean Platelet Volume: 10 fL — ABNORMAL LOW (ref 10.1–10.4)
Mono # K/uL: 0.5 10*3/uL (ref 0.3–1.1)
Monocyte %: 8 % (ref 2–12)
Neut # K/uL: 3.7 10*3/uL (ref 2.1–8.0)
Nucl RBC # K/uL: 0 10*3/uL (ref 0.0–0.012)
Nucl RBC %: 0 % (ref 0.0–0.2)
Platelets: 264 10*3 (ref 144–366)
RBC Distribution Width-SD: 43.3 fL (ref 41.0–45.3)
RBC: 4.5 10*6 (ref 4.16–5.34)
RDW: 12.6 % — ABNORMAL LOW (ref 12.8–14.2)
Seg Neut %: 58 % (ref 44–79)
WBC: 6.4 10*3/uL (ref 4.3–11.0)

## 2023-08-03 LAB — LIPID PANEL
Chol/HDL Ratio: 4.06
Cholesterol: 268 mg/dL — ABNORMAL HIGH (ref 107–200)
HDL: 66 mg/dL (ref 35–86)
LDL Calculated: 188 mg/dL — ABNORMAL HIGH (ref 0–100)
Triglycerides: 72 mg/dL (ref 35–150)

## 2023-08-03 LAB — VITAMIN D: Vitamin D 25 Hydroxy: 23 ng/mL — ABNORMAL LOW (ref 30–100)

## 2023-08-03 LAB — T4, FREE: Free T4: 1.06 ng/dL (ref 0.93–1.70)

## 2023-08-03 LAB — COMPREHENSIVE METABOLIC PANEL
ALT: 15 U/L (ref 0–33)
AST: 16 U/L (ref 0–40)
Albumin: 4.4 g/dL (ref 3.5–5.2)
Alk Phos: 78 U/L (ref 35–104)
Anion Gap: 12
Bilirubin,Total: 0.4 mg/dL (ref 0.0–1.0)
CO2: 24 MMOL/L (ref 22–29)
Calcium: 9.5 mg/dL (ref 8.6–10.4)
Chloride: 100 MMOL/L (ref 98–107)
Creatinine: 0.8 mg/dL (ref 0.5–0.9)
Glucose: 92 mg/dL (ref 70–100)
Lab: 16 mg/dL (ref 6–20)
Potassium: 4.3 MMOL/L (ref 3.5–5.1)
Sodium: 136 MMOL/L (ref 136–145)
Total Protein: 7.3 g/dL (ref 6.6–8.7)
eGFR BY CREAT: 96

## 2023-08-03 LAB — IRON
% Iron Saturation: 24 % (ref 9–55)
Iron: 88 ug/dL (ref 37–145)
TIBC: 368 ug/dL (ref 250–450)
UIBC: 280 ug/dL (ref 112–346)

## 2023-08-03 LAB — VITAMIN B12: Vitamin B12: 481 pg/mL (ref 211–946)

## 2023-08-03 LAB — TSH: TSH: 1.37 u[IU]/mL (ref 0.27–4.20)

## 2023-08-04 ENCOUNTER — Other Ambulatory Visit: Payer: Self-pay

## 2023-08-04 DIAGNOSIS — I7300 Raynaud's syndrome without gangrene: Secondary | ICD-10-CM

## 2023-08-12 ENCOUNTER — Encounter: Payer: Self-pay | Admitting: Cardiology

## 2023-08-12 NOTE — Provider Consult (Signed)
[facility]                                            [address]                                            [place], [place]  [address]                                              [phone]                                           CARDIOPULMONARY SERVICES        Patient Name:      Nancy Charles, Nancy Charles   Date of Birth:     [birth year]  Sex:               F  Medical Record #:  [id number]  Account #:         [id number]  Date of Service:   08/12/2023  Accession #:       [id number]  Location:          [facility]  Room:              Outpatient  Patient Type:      Outpatient     Ordering Provider:     Bethel Born   Reading Physician:     Louanne Skye MD [facility]  Technologist:          Wonda Cerise RDMS RVT RDCS  Primary Care Provider: PAJK,ASHLEY      Cardiologist:          No cardiologist     -------------------------------------------------------------------------     Transthoracic Echocardiogram     CPT Code(s):  * 93306 - Complete echo (includes 2D, Doppler and Color Flow).     Indication(s):   SHORTNESS OF BREATH     Parameters  Height: 1.63 m.   Weight: 70.3 kg.   BSA: 1.78 m.   BP: 126/67 mm Hg.  Sinus rhythm.   Heart rate: 72 bpm.  Patient History  Cardiac risk factors: R06.02 Shortness of breath.   R07.89 Other chest pain.   R94.31 Abnormal electrocardiogram.  Transthoracic Echocardiogram  Study quality: The study quality is good.   Study type: 93306 - Complete echo (includes 2D, Doppler and Color Flow).  --------------------  Summary     Left Ventricle: Normal left ventricular size.   Normal wall motion.   Normal left ventricular systolic function.   The estimated ejection fraction is 60-65%.   Normal geometry.   There is normal diastolic function.         Right Ventricle: Normal right ventricular size.   Normal systolic function.      Tricuspid Valve: There is no evidence of pulmonary hypertension.    There is no significant valvular disease.    Normal echocardiogram.  --------------------  Full Report  Left Ventricle  Left ventricular size is normal.   There are no regional wall motion abnormalities.  There is normal left ventricular systolic function.   The estimated ejection fraction is 60-65%.  EF (Estimated): 64.7 %.   The diastolic filling pattern is normal.   Normal geometry.  LV Mass I 45.6 g/m (43.0-95.0)  LV RWT 0.31 (0.22-0.42)  Right Ventricle  Right ventricular size is normal.   Normal systolic function is visualized.  Left Atrium  The left atrial size is normal.  LA Vol I (BP) 18.13 ml/m (16.00-34.00)  Right Atrium  The right atrial size is normal.  RA Area ES 12.23 cm  Aortic Valve  The aortic valve is trileaflet.   There is no evidence of aortic stenosis.   There is no evidence of aortic regurgitation.  Mitral Valve  The mitral valve leaflets are normal.   There is no evidence of mitral stenosis.   There is a trivial amount of mitral regurgitation.  Tricuspid Valve  Normal tricuspid valve leaflets.   There is no evidence of tricuspid stenosis.   There is no evidence of pulmonary hypertension.   There is trivial tricuspid regurgitation.  TR PGmax 16.58 mm Hg   RAP 15.00 mm Hg   RVSP 31.58 mm Hg  Pulmonary Valve  Normal pulmonary valve leaflets and function.   There is no evidence of pulmonary stenosis.   Trivial pulmonary regurgitation is visualized.  Pericardium  There is no pericardial effusion.  Aorta  The aortic root is normal in size for patient size and demographics.   The ascending aorta is normal in size for patient body size and demographics.  Ao  SV diam 30 mm (24-36)  Ao Asc diam 29 mm (19-35) Ao Arch diam 26 mm  Ao SV diam I 16.71 mm/m (14.00-22.00) Ao Asc diam I 16.32 mm/m   (10.00-22.00) Ao Arch  diam I 14.53 mm/m  IVC / Hepatic Veins  The inferior vena cava is dilated.      There is <50% respiratory collapse of the IVC.  IVC Diameter  VC Inf diam 22 mm  Septum  Interatrial septum  The atrial septum is  intact with no evidence of interatrial shunting.  Conclusion  There is no significant valvular disease.   Echocardiogram is within normal limits.  2D and MMode Measurements  2D - Ventricles 2D - Aorta and Atria MMode  IVSd 6 mm (6-9)  LVOT diam 21 mm   TAPSE 19 mm (=17)  LVIDd 44 mm (38-52) Ao SV diam 30 mm (24-36) RV S'(l) 13.64 cm/s  LVIDd I 24.58 mm/m (23.00-31.00) Ao SV diam I 16.71 mm/m (14.00-22.00)  LVPWd 7 mm (6-9) Ao Asc diam 29 mm (19-35)  LVIDs 29 mm (22-35) Ao Asc diam I 16.32 mm/m (10.00-22.00)  EF (BP) 64.7 % (54.0-74.0) Ao Arch diam 26 mm  EDV (BP) 100.17 ml (46.00-106.00) Ao Arch diam I 14.53 mm/m  ESV (BP) 35.35 ml (14.00-42.00)  LA diam systole 32 mm (27-38)  EDVI (BP) 56.22 ml/m (29.00-61.00)  LA diam systole Index 17.68 mm/m  ESVI (BP) 19.84 ml/m (8.00-24.00)  LA Vol (BP) 32.31 ml  RVDd base (RVD1) 33 mm (25-41)  LA Vol I (BP) 18.13 ml/m (16.00-34.00)  Diastolic Function  Spectral Doppler Tissue Doppler  MV E Vel 0.76 m/s   E'(l) 12.76 cm/s (=10.00)  MV A Vel 0.50 m/s   E'(s) 9.12 cm/s (=7.00)  MV E/A 1.50   E/E'(l) 5.92 (=13.00)  MV Dec.   Time 162 ms   E/E'(s) 8.29 (=15.00)  IVRT 74 ms   E/E' mean 6.91 (=14.00)  TR Vmax 2.04 m/s (=2.80)  Valve Measurements and Calculations  Aortic Valve Tricuspid and Pulmonary Valves  LVOT diam 21 mm   TR Vmax 2.04 m/s (=2.80)  TR PGmax 16.58 mm Hg  RVSP 31.58 mm Hg  RAP 15.00 mm Hg  PV Acc Time 134 ms        Electronically signed by: Louanne Skye MD [facility] on  08/12/2023 17:05:26    Thank you for the courtesy of this referral.                 Louanne Skye MD        08/12/23 1705           REPORT #:  [id number]     REPORT STATUS:  Signed

## 2023-08-14 ENCOUNTER — Other Ambulatory Visit: Payer: Self-pay

## 2023-08-15 ENCOUNTER — Ambulatory Visit
Admission: RE | Admit: 2023-08-15 | Discharge: 2023-08-15 | Disposition: A | Discharge status: Home / Self Care | Payer: Medicaid Other | Source: Ambulatory Visit | Admitting: Vascular Surgery

## 2023-08-15 DIAGNOSIS — I7300 Raynaud's syndrome without gangrene: Secondary | ICD-10-CM

## 2023-08-16 LAB — CV UPPER EXTREMITY SEGMENTAL PRESSURES COMPLETE
Left Brachial Index: 1 {ratio}
Left Brachial Pressure: 117 mm[Hg]
Left Radial Index: 1.04
Left Radial Pressure: 122 mm[Hg]
Left Ulnar Index: 1.02
Left Ulnar Pressure: 119 mm[Hg]
Right Brachial Index: 0.93 {ratio}
Right Brachial Pressure: 109 mm[Hg]
Right Radial Index: 0.98
Right Radial Pressure: 115 mm[Hg]
Right Ulnar Index: 1
Right Ulnar Pressure: 117 mm[Hg]

## 2023-08-22 ENCOUNTER — Ambulatory Visit: Payer: Medicaid Other

## 2023-12-16 ENCOUNTER — Other Ambulatory Visit: Payer: Self-pay | Admitting: Emergency Medicine

## 2023-12-16 LAB — CBC AND DIFFERENTIAL
Baso # K/uL: 0 10*3/uL (ref 0.0–0.2)
Basophil %: 0 % (ref 0–2)
Eos # K/uL: 0.2 10*3/uL (ref 0.0–0.7)
Eosinophil %: 2 % (ref 0–8)
Hematocrit: 38.3 % (ref 37.5–47.7)
Hemoglobin: 12.9 g/dL (ref 12.1–15.8)
Immature Granulocytes Absolute: 0.02 10*3/uL (ref 0.0–0.031)
Immature Granulocytes: 0.2 % (ref 0.0–0.4)
Lymph # K/uL: 2.3 10*3/uL (ref 1.0–5.2)
Lymphocyte %: 26 % (ref 13–42)
MCH: 30.5 pg (ref 26.4–33.6)
MCHC: 33.7 g/dL (ref 31.9–37.3)
MCV: 91 fL (ref 80–93)
Mean Platelet Volume: 9.6 fL — ABNORMAL LOW (ref 10.1–10.4)
Mono # K/uL: 0.9 10*3/uL (ref 0.3–1.1)
Monocyte %: 11 % (ref 2–12)
Neut # K/uL: 5.4 10*3/uL (ref 2.1–8.0)
Nucl RBC # K/uL: 0 10*3/uL (ref 0.0–0.012)
Nucl RBC %: 0 % (ref 0.0–0.2)
Platelets: 197 10*3 (ref 144–366)
RBC Distribution Width-SD: 39.6 fL — ABNORMAL LOW (ref 41.0–45.3)
RBC: 4.23 10*6 (ref 4.16–5.34)
RDW: 11.9 % — ABNORMAL LOW (ref 12.8–14.2)
Seg Neut %: 61 % (ref 44–79)
WBC: 8.79 10*3 (ref 4.3–11.0)

## 2023-12-16 LAB — BASIC METABOLIC PANEL
Anion Gap: 11
CO2: 25 MMOL/L (ref 22–29)
Calcium: 9.1 mg/dL (ref 8.6–10.4)
Chloride: 100 MMOL/L (ref 98–107)
Creatinine: 0.7 mg/dL (ref 0.5–0.9)
Glucose: 96 mg/dL (ref 70–100)
Lab: 18 mg/dL (ref 6–20)
Potassium: 4.1 MMOL/L (ref 3.5–5.1)
Sodium: 136 MMOL/L (ref 136–145)
eGFR BY CREAT: 113

## 2023-12-16 LAB — URINALYSIS WITH REFLEX TO MICROSCOPIC
Bilirubin,Ur: NEGATIVE
Blood,UA: NEGATIVE
Epithelial Cells: 10.4 /HPF — ABNORMAL HIGH (ref 0–2)
Glucose,UA: NEGATIVE mg/dL
Hyaline Casts,UA: 3.19 /LPF (ref 0–10)
Nitrite,UA: NEGATIVE
Protein,UA: 30 mg/dL — ABNORMAL HIGH
RBC,UA: 2.5 /HPF — ABNORMAL HIGH (ref 0–2)
Specific Gravity,UA: 1.034 — ABNORMAL HIGH (ref 1.005–1.025)
Urobilinogen,UA: 1 U/dL (ref 0.2–1.0)
WBC,UA: 6.4 /HPF — ABNORMAL HIGH (ref 0–5)
pH,UR: 6 (ref 5.0–8.0)

## 2023-12-16 LAB — HCG SLIDE URINE: HCG Slide Urine: NEGATIVE

## 2023-12-17 ENCOUNTER — Encounter: Payer: Self-pay | Admitting: Emergency Medicine

## 2023-12-17 ENCOUNTER — Other Ambulatory Visit: Payer: Self-pay | Admitting: Emergency Medicine

## 2023-12-17 LAB — BASIC METABOLIC PANEL
Anion Gap: 10
CO2: 25 MMOL/L (ref 22–29)
Calcium: 9.2 mg/dL (ref 8.6–10.4)
Chloride: 102 MMOL/L (ref 98–107)
Creatinine: 0.6 mg/dL (ref 0.5–0.9)
Glucose: 98 mg/dL (ref 70–100)
Lab: 13 mg/dL (ref 6–20)
Potassium: 3.4 MMOL/L — ABNORMAL LOW (ref 3.5–5.1)
Sodium: 137 MMOL/L (ref 136–145)
eGFR BY CREAT: 117

## 2023-12-17 LAB — MAGNESIUM: Magnesium: 2 mg/dL (ref 1.7–2.6)

## 2023-12-17 LAB — D-DIMER, QUANTITATIVE: D-Dimer: 1.06 mg/L — ABNORMAL HIGH (ref <0.49)

## 2023-12-17 LAB — TROPONIN T 0 HR HIGH SENSITIVITY (IP/ED ONLY)

## 2023-12-17 NOTE — ED Provider Notes (Signed)
 [facility]  [address]  [place], [place]  [address], [place]  [address]  [phone]     PATIENT: Nancy Charles, Nancy Charles LDOB: 1986-06-13  MR: [id number] Account: [id number]  Attending: ANGELYN MAURINE BRAVO MDDate of Service: 12/16/23        Emergency Department Physician Report        History of Present Illness     General  Chief Complaint Abdominal Pain  Stated Complaint ABD PAIN/LOWER BACK PAIN  Time Seen by MD 1851  Source patient, RN notes reviewed  Exam Limitations no limitations  Initial Comments  38 yo female presents to ED for evaluation of rt pelvic and lower back pain.   She has had  intermittent pain for a few days, but it is worse and more consant today and   not relieve  by naproxen at home. No migration or radiation of the pain. She reports it   feels like   prior ovarian cysts. She has an OCP patch and reports LMP one month ago   without unusual   breakthrough bleeding or discharge. She denies fever, N/V/D. She has a   horseshoe kidney   and has had kidney stones in past but reports this does not feel similar to   that.   Allergies  Coded Allergies:  No Known Drug Allergy (08/04/19)  No Known Latex Allergy (04/20/20)        Past Medical History  Nursing PMH  HORSESHOE KIDNEY, KIDNEY STONES  Medical History horseshoe kidney, kidney stones  Surgical History noncontributory  Psychosocial History no pertinent psych hx  Significant Family History heart disease (mother with MI in 86s)     Review of Systems  Constitutional  denies: fever.   EENTM  denies: nose congestion.   Respiratory  denies: cough.   Cardiovascular  denies: chest pain.   Gastrointestinal  reports: see HPI, abdominal pain.  denies: constipation, diarrhea, nausea,   vomiting.   Genitourinary  reports: frequency.  denies: dysuria, hematuria.   Musculoskeletal  reports: see HPI, back pain.   Skin  denies: change in color.    Psychiatric/Neurological  denies: headache.   Hematologic/Lymphatic  reports: no symptoms reported.   Immunological/Allergic  reports: no symptoms reported.      Physical Exam Medical  General Appearance WD/WN, no apparent distress  Eyes  bilateral eye PERRL  Ear, Nose, Throat hearing grossly normal  Neck normal inspection, supple  Respiratory lungs clear, normal breath sounds, no respiratory distress  Cardiovascular regular rate/rhythm, no murmur  Gastrointestinal normal bowel sounds, soft, tenderness (RLQ)  Back normal inspection  Upper Extremity  Bilateral normal range of motion, Bilateral normal inspection  Lower Extremity  Bilateral normal range of motion, Bilateral normal inspection  Neurologic/Psychiatric alert, normal mood/affect, oriented x 3  Skin normal color, warm/dry  Ht:(ft): 5  In: 4.00  Kg: 68.946     Course     DIFFERENTIAL - Diagnostic  Diagnostic Considerations Dehydration, Metabolic Abnormaility, Abdominal Pain,   GI   Disorder, UTI, pregnancy, ovarian cyst, torsion, appendicitis, kidney stone  Orders, Labs, Meds  Orders                     Procedure                     Date/time   Status  URINE CULTURE                 05/02 2118  Active                     PELVIC SCAN COMPLETE          05/02 1954  Complete                     CBC WITH DIFFERENTIAL         05/02 1954  Complete                     BASIC METABOLIC PANEL         05/02 1954  Complete                     DC     IV Site#1              05/02 1954  Active                     Insert IV Site #1             05/02 1954  Active                     HCG SLIDE URINE               05/02 1853  Complete                     Specimen Collection           05/02 1853  Active                     DUP SCAN ART/VEN ABD,PEL,ETC  05/02  UNK  Active     Current Medications                                          Sig/Sch   Start time          Last               Medication       Dose      Route     Stop Time   Status  Admin                Cefdinir           300 MG  NOW ONE   05/02 2200  DC      05/02                                          PO        05/02 2201           2143               Sodium Chloride   1,000 ML      .Q1H  05/02 2000  DC      05/02  IV        05/02 2059           2026               Ketorolac            15 MG  STAT STA  05/02 1954  DC      05/02               Tromethamine                IV        05/02 1955           2027     Laboratory Tests                                                      05/02              05/02    05/02                                                       2118               2118     2018      Chemistry        Sodium (136 - 145 MMOL/L)                                                   136        Potassium (3.5 - 5.1 MMOL/L)                                                4.1        Chloride (98 - 107 MMOL/L)                                                  100        Carbon Dioxide (22 - 29 MMOL/L)                                              25        Anion Gap                                                                    11        BUN (  6 - 20 MG/DL)                                                           18        Creatinine (0.5 - 0.9 MG/DL)                                                0.7        eGFR                                                                        113        Fasting Glucose (70 - 100 MG/DL)                                             96        Calcium (8.6 - 10.4 MG/DL)                                                  9.1      Hematology        WBC (4.3 - 11.0 X10 3)                                                     8.79        RBC (4.16 - 5.34 X10 6)                                                    4.23        Hgb (12.1 - 15.8 g/dL)                                                     12.9        Hct (37.5 - 47.7 %)                                                        38.3  MCV (80 - 93 fL)                                                              91        MCH (26.4 - 33.6 pg)                                                       30.5        MCHC (31.9 - 37.3 g/dL)                                                    33.7        Plt Count (144 - 366 X10 3)                                                 197        Immature Gran % (0.0 - 0.4 %)                                               0.2        Neutrophils % (44 - 79 %)                                                    61        Lymphocytes % (13 - 42 %)                                                    26        Monocytes % (2 - 12 %)                                                       11        Eosinophils % (0 - 8 %)                                                       2        Basophils % (0 - 2 %)  0        Immature Gran # (0.0 - 0.031 X10 3/uL)                                    0.020        Absolute Neutrophils (2.1 - 8.0 X10 3/uL)                                   5.4        Absolute Lymphocytes (1.0 - 5.2 X10 3/uL)                                   2.3        Absolute Monocytes (0.3 - 1.1 X10 3/uL)                                     0.9        Absolute Eosinophils (0.0 - 0.7 X10 3/uL)                                   0.2        Absolute Basophils (0.0 - 0.2 X10 3/uL)                                     0.0        Nucleated RBCs (0.0 - 0.2 %)                                                0.0      Urines        Urine Source                                         CLEAN CATCH URINE        Urine Color                                          Dark Yellow        Urine Clarity (Clear)                                Cloudy        Urine pH (5.0 - 8.0)                                               6.0        Urine Blood (Negative)  Negative        Urine Nitrite (Negative)                             Negative        Urine Bilirubin (Negative)                           Negative        Urine  Urobilinogen (0.2 - 1.0 E.U./dL)                             1.0        Hyaline Casts (0 - 10 /LPF)                                       3.19        Urinalysis Comment        Urine Glucose (Negative mg/dL)                       Negative        Urine Pregnancy Test (NEGATIVE)            NEGATIVE     Laboratory Tests                                                   05/02              05/02       05/02                                                    2018               2118        2118      Chemistry        Sodium (136 - 145 MMOL/L)                    136        Potassium (3.5 - 5.1 MMOL/L)                 4.1        Chloride (98 - 107 MMOL/L)                   100        Carbon Dioxide (22 - 29 MMOL/L)               25        Anion Gap                                     11        BUN (6 - 20 MG/DL)  18        Creatinine (0.5 - 0.9 MG/DL)                 0.7        eGFR                                         113        Fasting Glucose (70 - 100 MG/DL)              96        Calcium (8.6 - 10.4 MG/DL)                   9.1      Hematology        WBC (4.3 - 11.0 X10 3)                      8.79        RBC (4.16 - 5.34 X10 6)                     4.23        Hgb (12.1 - 15.8 g/dL)                      87.0        Hct (37.5 - 47.7 %)                         38.3        MCV (80 - 93 fL)                              91        MCH (26.4 - 33.6 pg)                        30.5        MCHC (31.9 - 37.3 g/dL)                     66.2        RDW (41.0 - 45.3 fL)                        39.6        Plt Count (144 - 366 X10 3)                  197        MPV (10.1 - 10.4 fL)                         9.6        Immature Gran % (0.0 - 0.4 %)                0.2        Neutrophils % (44 - 79 %)                     61        Lymphocytes % (13 - 42 %)                     26  Monocytes % (2 - 12 %)                        11        Eosinophils % (0 - 8 %)                        2        Basophils % (0 - 2  %)                          0        Immature Gran # (0.0 - 0.031 X10 3/uL)     0.020        Absolute Neutrophils (2.1 - 8.0 X10 3/uL)    5.4        Absolute Lymphocytes (1.0 - 5.2 X10 3/uL)    2.3        Absolute Monocytes (0.3 - 1.1 X10 3/uL)      0.9        Absolute Eosinophils (0.0 - 0.7 X10 3/uL)    0.2        Absolute Basophils (0.0 - 0.2 X10 3/uL)      0.0        Nucleated RBCs (0.0 - 0.2 %)                 0.0      Urines        Urine Source                                      CLEAN CATCH URINE        Urine Color                                       Dark Yellow        Urine Clarity (Clear)                             Cloudy        Urine pH (5.0 - 8.0)                                            6.0        Ur Specific Gravity (1.005 - 1.025)                           1.034        Urine Protein (Negative mg/dL)                                   30        Urine Ketones (Negative mg/dL)                    Trace        Urine Blood (Negative)  Negative        Urine Nitrite (Negative)                          Negative        Urine Bilirubin (Negative)                        Negative        Urine Urobilinogen (0.2 - 1.0 E.U./dL)                          1.0        Ur Leukocyte Esterase (Negative)                  Trace        Urine RBC (0 - 2 /HPF)                                          2.5        Urine WBC (0 - 5 /HPF)                                          6.4        Ur Epithelial Cells (0 - 2 /HPF)                               10.4        Urine Bacteria (None Seen)                                       1+        Hyaline Casts (0 - 10 /LPF)                                    3.19        Urinalysis Comment        Urine Glucose (Negative mg/dL)                    Negative        Urine Pregnancy Test (NEGATIVE)                                        NEGATIVE     Microbiology                              Date/Time   Procedure - Status                              Source      Growth                               05/02 2118  Urine Culture - RES  CLEAN     Vital Signs        Date Time   Temp  Pulse  Resp  B/P     B/P   Pulse  O2        O2 Flow    FiO2                                               Mean  Ox     Delivery  Rate        05/02 1837  97.7     86    16  112/78    89     98  Room Air      0.0        Departure Impression  Primary Impression: Pelvic pain  Secondary Impressions:   Ovarian cyst  Qualifiers:  Laterality: right Qualified Code: N83.201 - Unspecified ovarian   cyst, right  side     UTI (urinary tract infection)  Qualifiers:  Urinary tract infection type: acute cystitis Hematuria presence:   without   hematuria Qualified Code: N30.00 - Acute cystitis without hematuria     Course Text  38 yo female presents to ED for evaluation of RLQ abdominal/pelvic pain and   lower back   pain. She denies other GU symptoms but feels this is most similar to pain with   prior   ovarian cysts. Will eval further w/ labs, UA, pelvic US  and trial toradol  for   analgesia.  Labs show no actionable abnormalities.   UAwith small pyuria, leuk esterase, moderate epis. D/w pt, will treat.  Pelvic US  per sonographer [facility] measured 1.28cm. 4.23 cm septated right ovarian   cyst.   Blood flow visualized to RT ovary. LEFT ovary was not visualized during exam   and patient  did not wantto move forward with transvaginal exam.   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 She is  alert, oriented and free of intoxication or distracting injury. Despite an   offer of work  excuse and my concern for possible other potential causes of her pain   including   appendicitis, she did not want to stay for CT a/p. Her pain was improved w/   toradol  and   she plans to f/u with her OBGYN to monitor ovarian cyst and will finish course   of abx   for UTI. She expressed agreement with returning for new/worsening symptoms and     understands the limatations of the studies  we were able to complete today.   Subsequently,  patient was discharged against medical advice.          ANGELYN MAURINE BRAVO MD              Signature on File       Sign Date/Time:   12/17/23  1805                                                          Sign Date/Time:      Report Number: [id number]

## 2023-12-18 LAB — AEROBIC CULTURE

## 2023-12-18 LAB — TROPONIN T 1 HR W/ DELTA HIGH SENSITIVITY

## 2023-12-18 LAB — TROPONIN T 3 HR W/ DELTA HIGH SENSITIVITY (IP/ED ONLY)

## 2023-12-22 NOTE — ED Provider Notes (Signed)
 [facility]  [address]  [place], [place]  [address], [place]  [address]  [phone]     PATIENT: Nancy Charles, Nancy Charles LDOB: 30-Jul-1986  MR: [id number] Account: [id number]  Attending: ANGELYN MAURINE BRAVO MDDate of Service: 12/17/23        Emergency Department Physician Report        History of Present Illness     General  Chief Complaint Heart Palpitations  Stated Complaint CHEST PAIN,FATIGUE  Source patient, RN notes reviewed  Exam Limitations no limitations  Initial Comments  38 yo female presents to ED for evaluation of intermittent chest pain,   palpitations that  make her feel like she needs to cough, most present at rest. Some assocaited   SOB. No   fever, cough, leg swelling. She is on oral contraception. No personal h/o VTE.   No recent  immobilization. Has had som palpitations and CP in past but this is worse--   Echo five   months ago showed no significant valvular abnormalities; normal   echocardiogram. Pt was   here two days ago for evaluation of lower abdominal pain, diagnosed with UTI   and ovarian  cyst and pt notes she has did have these symptoms that day as well But they   weren't as   bad.   Allergies  Coded Allergies:  No Known Drug Allergy (12/17/23)  No Known Latex Allergy (12/17/23)        Past Medical History  Nursing PMH  HORSESHOE KIDNEY, KIDNEY STONES  Medical History horseshoe kidney, kidney stones  Surgical History noncontributory  Psychosocial History no pertinent psych hx  Significant Family History heart disease (mother with MI in 2s)     Review of Systems  Constitutional  denies: fever.   EENTM  denies: nose congestion.   Respiratory  reports: short of breath.  denies: cough.   Cardiovascular  reports: see HPI, chest pain, palpitations.  denies: edema, syncope.   Gastrointestinal  reports: see HPI, abdominal pain.  denies: constipation, diarrhea.   Genitourinary  reports: see HPI.    Musculoskeletal  denies: back pain.   Skin  denies: change in color.   Psychiatric/Neurological  denies: headache.   Hematologic/Lymphatic  reports: no symptoms reported.   Immunological/Allergic  reports: no symptoms reported.      Physical Exam Medical  General Appearance WD/WN, no apparent distress  Eyes  bilateral eye PERRL  Ear, Nose, Throat hearing grossly normal  Neck normal inspection, supple, full range of motion  Respiratory lungs clear, normal breath sounds, no respiratory distress  Cardiovascular regular rate/rhythm, no edema, no murmur  Upper Extremity  Bilateral normal range of motion, Bilateral normal inspection  Lower Extremity  Bilateral normal range of motion, Bilateral normal inspection, Bilateral no   pedal edema  Neurologic/Psychiatric alert, normal mood/affect, oriented x 3  Skin normal color, warm/dry     Course     DIFFERENTIAL - Diagnostic  Diagnostic Considerations Dehydration, Metabolic Abnormaility, Acute Coronary   Syndrome,   dysrhythmia, anemia, PE, PVCs, anxiety  Independent interpretation EKG  Orders, Labs, Meds  Orders                     Procedure                     Date/time   Status  Troponin T High Sensitivity   05/03 1901  Complete                     MAGNESIUM                     05/03 1901  Complete                     D-DIMER                       05/03 1901  Complete                     BASIC METABOLIC PANEL         05/03 1901  Complete                     DC     IV Site#1              05/03 1901  Active                     Insert IV Site #1             05/03 1901  Active                     12 LEAD EKG W INTERPRETATION  05/03 1829  Active     Laboratory Tests                                                         05/03  05/03     05/03                                                          2246   2046      1946             Chemistry               Troponin T High Sens                                    Complete               Troponin T Hi Sens 1Hr  (0.0 - 19.0 ng/L)         TNP               Troponin T Hi Sens 3Hr (0.0 - 19.0 ng/L)  TNP                                                                       05/03  05/03  1946   1946           Chemistry             Sodium (136 - 145 MMOL/L)                                       137             Chloride (98 - 107 MMOL/L)                                      102             Carbon Dioxide (22 - 29 MMOL/L)                                  25             Anion Gap                                                        10             BUN (6 - 20 MG/DL)                                               13             Creatinine (0.5 - 0.9 MG/DL)                                    0.6             eGFR                                                            117             Fasting Glucose (70 - 100 MG/DL)                                 98             Calcium (8.6 - 10.4 MG/DL)                                      9.2             Magnesium (1.7 - 2.6 MG/DL)                                     2.0  Troponin T Hi Sens Baseline (0.0 - 19.0 ng/L)  LESS THAN 6.0     Laboratory Tests                                                       05/03          05/03       05/03                                                        1946           1946        1946      Chemistry        Sodium (136 - 145 MMOL/L)                        137        Potassium (3.5 - 5.1 MMOL/L)                     3.4        Chloride (98 - 107 MMOL/L)                       102        Carbon Dioxide (22 - 29 MMOL/L)                   25        Anion Gap                                         10        BUN (6 - 20 MG/DL)                                13        Creatinine (0.5 - 0.9 MG/DL)                     0.6        eGFR                                             117        Fasting Glucose (70 - 100 MG/DL)                  98        Calcium (8.6 - 10.4 MG/DL)                        9.2        Magnesium (1.7 - 2.6 MG/DL)                      2.0        Troponin T High Sens  Complete        Troponin T Hi Sens Baseline (0.0 - 19.0 ng/L)         LESS THAN 6.0      Coagulation        D-Dimer (<0.49 mg/L)                            1.06                                                                 05/03  05/03                                                               2046   2246                  Chemistry                    Troponin T Hi Sens 1Hr (0.0 - 19.0 ng/L)  TNP                    Troponin T Hi Sens 3Hr (0.0 - 19.0 ng/L)         TNP     Vital Signs        Date Time   Temp  Pulse  Resp  B/P     B/P   Pulse  O2        O2 Flow    FiO2                                               Mean  Ox     Delivery  Rate        05/03 2328           86        05/03 2300  97.8     82    18  116/53           98  Room Air      0.0        05/03 1828  97.2     89    18  120/85    96     99  Room Air      0.0        Departure Impression  Primary Impression: Palpitations  Course Text  38 yo female presents to ED for evaluation of intermittent CP, SOB,   palpitations,   predominately at rest. Atypical for ACS. Requires labs, EKG. Cannot PERC out   due to OCP   use.  EKG interpreted myself demonstrates NSR with rate of 86. RSR' in V1 likely   normal   variant. Borderline flat Twaves diffusely. No STE. SImilar to priors.     Lab show elevated ddimer. Otherwise minimally elevated K-- oral supplement   provided.   Troponin undetectable.  HEART score 1.     CTA chest per radiology no PE. No acute disease.  F/u with PCP, cardiology for holter monitoring outpt.      All questions addressed. Return precautions provided and pt discharged home in   agreement  with and understanding of plan.          ANGELYN MAURINE BRAVO MD              Signature on File       Sign Date/Time:   12/22/23  1813                                                          Sign Date/Time:      Report  Number: [phone]

## 2024-01-26 ENCOUNTER — Other Ambulatory Visit: Payer: Self-pay | Admitting: Emergency Medicine

## 2024-01-26 LAB — URINALYSIS WITH REFLEX TO MICROSCOPIC
Bilirubin,Ur: NEGATIVE
Epithelial Cells: 3.5 /HPF — ABNORMAL HIGH (ref 0–2)
Glucose,UA: NEGATIVE mg/dL
Hyaline Casts,UA: 0.37 /LPF (ref 0–10)
Ketones, UA: 15 mg/dL — ABNORMAL HIGH
Leuk Esterase,UA: NEGATIVE
Nitrite,UA: NEGATIVE
RBC,UA: 12.8 /HPF — ABNORMAL HIGH (ref 0–2)
Specific Gravity,UA: 1.018 (ref 1.005–1.025)
Urobilinogen,UA: 0.2 U/dL (ref 0.2–1.0)
WBC,UA: 1.7 /HPF (ref 0–5)
pH,UR: 6 (ref 5.0–8.0)

## 2024-01-26 LAB — LACTATE, PLASMA: Lactate: 0.9 mmol/L (ref 0.5–2.2)

## 2024-01-26 LAB — CBC AND DIFFERENTIAL
Baso # K/uL: 0 10*3/uL (ref 0.0–0.2)
Basophil %: 0 % (ref 0–2)
Eos # K/uL: 0.1 10*3/uL (ref 0.0–0.7)
Eosinophil %: 1 % (ref 0–8)
Hematocrit: 38.6 % (ref 37.5–47.7)
Hemoglobin: 12.8 g/dL (ref 12.1–15.8)
Immature Granulocytes Absolute: 0.04 10*3/uL — ABNORMAL HIGH (ref 0.0–0.031)
Immature Granulocytes: 0.4 % (ref 0.0–0.4)
Lymph # K/uL: 1.4 10*3/uL (ref 1.0–5.2)
Lymphocyte %: 13 % (ref 13–42)
MCH: 30.6 pg (ref 26.4–33.6)
MCHC: 33.2 g/dL (ref 31.9–37.3)
MCV: 92 fL (ref 80–93)
Mean Platelet Volume: 9.8 fL — ABNORMAL LOW (ref 10.1–10.4)
Mono # K/uL: 0.7 10*3/uL (ref 0.3–1.1)
Monocyte %: 7 % (ref 2–12)
Neut # K/uL: 8.7 10*3/uL — ABNORMAL HIGH (ref 2.1–8.0)
Nucl RBC # K/uL: 0 10*3/uL (ref 0.0–0.012)
Nucl RBC %: 0 % (ref 0.0–0.2)
Platelets: 220 10*3 (ref 144–366)
RBC Distribution Width-SD: 42.2 fL (ref 41.0–45.3)
RBC: 4.18 10*6 (ref 4.16–5.34)
RDW: 12.4 % — ABNORMAL LOW (ref 12.8–14.2)
Seg Neut %: 79 % (ref 44–79)
WBC: 11 10*3 (ref 4.3–11.0)

## 2024-01-26 LAB — COMPREHENSIVE METABOLIC PANEL
ALT: 15 U/L (ref 0–33)
AST: 17 U/L (ref 0–40)
Albumin: 4.2 g/dL (ref 3.5–5.2)
Alk Phos: 78 U/L (ref 35–104)
Anion Gap: 13
Bilirubin,Total: 0.2 mg/dL (ref 0.0–1.0)
CO2: 21 MMOL/L — ABNORMAL LOW (ref 22–29)
Calcium: 9.6 mg/dL (ref 8.6–10.4)
Chloride: 104 MMOL/L (ref 98–107)
Creatinine: 0.9 mg/dL (ref 0.5–0.9)
Glucose: 115 mg/dL — ABNORMAL HIGH (ref 70–100)
Lab: 20 mg/dL (ref 6–20)
Potassium: 3.9 MMOL/L (ref 3.5–5.1)
Sodium: 138 MMOL/L (ref 136–145)
Total Protein: 6.9 g/dL (ref 6.6–8.7)
eGFR BY CREAT: 83

## 2024-01-26 LAB — PREGNANCY TEST, SERUM: Preg,Serum: NEGATIVE

## 2024-01-26 LAB — LIPASE: Lipase: 25 U/L (ref 13–60)

## 2024-01-27 NOTE — ED Provider Notes (Signed)
 [facility]  [address]  [place], [place]  [id number], [place]  [address]  [phone]     PATIENT: Nancy Charles, Nancy Charles LDOB: 25-Sep-1985  MR: [id number] Account: [id number]  Attending: Boris Byars MDDate of Service: 01/26/24        Emergency Department Physician Report        History of Present Illness     General  Chief Complaint Abdominal Pain  Stated Complaint ABD/BACK PAIN  Time Seen by MD 2156  Source patient  Exam Limitations no limitations  Initial Comments  38 year old female with history of horseshoe kidney and kidney stones presents   from home  for evaluation of right flank and right lower quadrant abdominal pain.  Began   with some   cramping type pain this morning.  She thought it was from endometriosis.  She   saw her   gynecologist and was medicated with Toradol .  Then around 5:30, her pain   worsened.    Nausea associated but no vomiting.  No vaginal bleeding or discharge.  Pain is   worse   with movement.  No numbness or weakness in the leg.  No fever or chills.  She   is worried  about the possibility of hydronephrosis.  Follows with center for urology   Allergies  Coded Allergies:  No Known Drug Allergy (01/26/24)  No Known Latex Allergy (01/26/24)        Past Medical History  Nursing PMH  HORSESHOE KIDNEY, KIDNEY STONES  Medical History horseshoe kidney, kidney stones  Surgical History noncontributory  Psychosocial History no pertinent psych hx  Significant Family History heart disease (mother with MI in 22s)  Smoking status Never,a smoker  Alcohol none  Drugs none     Confirmation of History  Confirmed with patient Medical History, Surgical History, Psychosocial   History, Family   History, Smoking Status, Alcohol Use, Drug Use     Review of Systems  Constitutional  denies: chills, fever.   Respiratory  denies: cough, short of breath.   Cardiovascular  denies: chest pain.    Gastrointestinal  reports: abdominal pain, nausea.  denies: diarrhea, vomiting.   Genitourinary  reports: see HPI.  denies: dysuria, frequency, hematuria.   Musculoskeletal  reports: back pain.   Skin  denies: rash.   Psychiatric/Neurological  denies: headache, numbness, weakness.      Physical Exam Medical  General Appearance no apparent distress  Ear, Nose, Throat normal pharynx  Neck normal inspection, non-tender, supple, full range of motion  Respiratory lungs clear, normal breath sounds, no respiratory distress, no   accessory   muscle use  Cardiovascular regular rate/rhythm, no murmur, normal peripheral pulses  Peripheral Pulses  2+ radial (R), 2+ femoral (R), 2+ femoral (L)  Gastrointestinal abdomen is soft.  There is some mild right lower quadrant   tenderness   without rebound or guarding  Back CVA tenderness (R)  Upper Extremity  Bilateral normal range of motion  Lower Extremity  Bilateral normal range of motion  Neurologic/Psychiatric alert, normal mood/affect, no motor/sensory deficits, 5   out of 5   strength of hip flexion along with flexion and extension at the knees and   ankles with   sensation intact to light touch  Skin normal color  Ht:(ft): 5  In: 5.00     Course     DIFFERENTIAL - Diagnostic  Diagnostic Considerations Pancreatitis, PUD/Gastritis, Gastroenteritis, Renal   Stone,   Pyelonephritis/UTI  Independent  interpretation ultrasound, CT scans  Orders, Labs, Meds  Orders                    Procedure                      Date/time   Status                    PELVIC SCAN COMPLETE           06/12 2206  Active                    CT ABD PEL UNENHANCED          06/12 2156  Active                    LIPASE                         06/12 2011  Complete                    LACTIC ACID                    06/12 2011  Complete                    COMPREHENSIVE METABOLIC PANEL  06/12 2011  Complete                    CBC WITH DIFFERENTIAL          06/12 2011  Complete                    BHCG, QL (SERUM  PREGNANCY)     06/12 2011  Complete                    Specimen Collection            06/12 2011  Active                    NPO                            06/12 2011  Active                    DC     IV Site#1               06/12 2011  Active                    Insert IV Site #1              06/12 2011  Active                    DUP SCAN ART/VEN ABD,PEL,ETC   06/12  UNK  Active     Current Medications                                           Sig/Sch   Start time          Last              Medication         Dose  Route     Stop Time   Status  Admin              Tamsulosin  HCl       0.4 MG  STAT STA  06/13 0101  DC                                           PO        06/13 0102              Ondansetron  HCl        8 MG  [place] STA  06/13 0055  DC                                           PO        06/13 0056              Oxycodone /            2 TAB  [place] STA  06/13 0055  DC              Acetaminophen                 PO        06/13 0056              Hydromorphone  HCl      1 MG  NOW ONE   06/12 2230  DC      06/12                                           IV        06/12 2231           2218              Ketorolac              30 MG  STAT STA  06/12 2202  DC      06/12              Tromethamine                  IV        06/12 2203           2218              Sodium Chloride     1,000 ML      .Q1H  06/12 2200  DC      06/12                                           IV        06/12 2259           2217     Laboratory Tests  06/12  06/12                06/12                                                       2055   2055                 2055      Chemistry        Sodium (136 - 145 MMOL/L)                                                   138        Potassium (3.5 - 5.1 MMOL/L)                                                3.9        Chloride (98 - 107 MMOL/L)                                                  104        Anion Gap                                                                     13        BUN (6 - 20 MG/DL)                                                           20        Creatinine (0.5 - 0.9 MG/DL)                                                0.9        eGFR                                                                         83        Lactic Acid (0.5 - 2.2 mmol/L)  0.9        Calcium (8.6 - 10.4 MG/DL)                                                  9.6        Total Bilirubin (0.0 - 1.0 MG/DL)                                           0.2        AST (0 - 40 U/L)                                                             17        ALT (0 - 33 U/L)                                                             15        Alkaline Phosphatase (35 - 104 U/L)                                          78        Total Protein (6.6 - 8.7 GM/DL)                                             6.9        Albumin (3.5 - 5.2 GM/DL)                                                   4.2        Lipase (13 - 60 U/L)                                                         25        Serum Pregnancy, Qual (NEGATIVE)           NEGATIVE      Hematology        WBC (4.3 - 11.0 X10 3)  11.00        RBC (4.16 - 5.34 X10 6)                                                    4.18        Hgb (12.1 - 15.8 g/dL)                                                     12.8        Hct (37.5 - 47.7 %)                                                        38.6        MCV (80 - 93 fL)                                                             92        MCH (26.4 - 33.6 pg)                                                       30.6        MCHC (31.9 - 37.3 g/dL)                                                    33.2        RDW (41.0 - 45.3 fL)                                                       42.2        Plt Count (144 - 366 X10 3)                                                 220        Immature Gran % (0.0 - 0.4 %)                                                0.4  Neutrophils % (44 - 79 %)                                                    79        Lymphocytes % (13 - 42 %)                                                    13        Monocytes % (2 - 12 %)                                                        7        Eosinophils % (0 - 8 %)                                                       1        Basophils % (0 - 2 %)                                                         0        Absolute Lymphocytes (1.0 - 5.2 X10 3/uL)                                   1.4        Absolute Monocytes (0.3 - 1.1 X10 3/uL)                                     0.7        Absolute Eosinophils (0.0 - 0.7 X10 3/uL)                                   0.1        Absolute Basophils (0.0 - 0.2 X10 3/uL)                                     0.0        Nucleated RBCs (0.0 - 0.2 %)                                                0.0      Urines  Urine Source                                                CLEAN CATCH   URINE        Urine Color                                                 Yellow        Urine Clarity (Clear)                                       Clear        Urine pH (5.0 - 8.0)                                                        6.0        Ur Specific Gravity (1.005 - 1.025)                                       1.018        Urine Nitrite (Negative)                                    Negative        Urine Bilirubin (Negative)                                  Negative        Urine Urobilinogen (0.2 - 1.0 E.U./dL)                                      0.2        Ur Leukocyte Esterase (Negative)                            Negative        Urine WBC (0 - 5 /HPF)                                                      1.7        Hyaline Casts (0 - 10 /LPF)                                                0.37        Urinalysis Comment  Urine Glucose (Negative mg/dL)                              Negative     Laboratory Tests                                                                06/12  06/12       06/12                                                                2055   2055        2055      Chemistry        Sodium (136 - 145 MMOL/L)                                138        Potassium (3.5 - 5.1 MMOL/L)                             3.9        Chloride (98 - 107 MMOL/L)                               104        Carbon Dioxide (22 - 29 MMOL/L)                           21        Anion Gap                                                 13        BUN (6 - 20 MG/DL)                                        20        Creatinine (0.5 - 0.9 MG/DL)                             0.9        eGFR                                                      83        Fasting Glucose (70 - 100 MG/DL)  115        Lactic Acid (0.5 - 2.2 mmol/L)                                  0.9        Calcium (8.6 - 10.4 MG/DL)                               9.6        Total Bilirubin (0.0 - 1.0 MG/DL)                        0.2        AST (0 - 40 U/L)                                          17        ALT (0 - 33 U/L)                                          15        Alkaline Phosphatase (35 - 104 U/L)                       78        Total Protein (6.6 - 8.7 GM/DL)                          6.9        Albumin (3.5 - 5.2 GM/DL)                                4.2        Lipase (13 - 60 U/L)                                      25        Serum Pregnancy, Qual (NEGATIVE)                                       NEGATIVE      Hematology        WBC (4.3 - 11.0 X10 3)                                 11.00        RBC (4.16 - 5.34 X10 6)                                 4.18        Hgb (12.1 - 15.8 g/dL)                                  12.8  Hct (37.5 - 47.7 %)                                     38.6        MCV (80 - 93 fL)                                          92        MCH (26.4 - 33.6 pg)                                    30.6        MCHC (31.9 -  37.3 g/dL)                                 33.2        RDW (41.0 - 45.3 fL)                                    42.2        Plt Count (144 - 366 X10 3)                              220        MPV (10.1 - 10.4 fL)                                     9.8        Immature Gran % (0.0 - 0.4 %)                            0.4        Neutrophils % (44 - 79 %)                                 79        Lymphocytes % (13 - 42 %)                                 13        Monocytes % (2 - 12 %)                                     7        Eosinophils % (0 - 8 %)                                    1        Basophils % (0 - 2 %)  0        Immature Gran # (0.0 - 0.031 X10 3/uL)                 0.040        Absolute Neutrophils (2.1 - 8.0 X10 3/uL)                8.7        Absolute Lymphocytes (1.0 - 5.2 X10 3/uL)                1.4        Absolute Monocytes (0.3 - 1.1 X10 3/uL)                  0.7        Absolute Eosinophils (0.0 - 0.7 X10 3/uL)                0.1        Absolute Basophils (0.0 - 0.2 X10 3/uL)                  0.0        Nucleated RBCs (0.0 - 0.2 %)                             0.0      Urines        Urine Source                               CLEAN CATCH URINE        Urine Color                                Yellow        Urine Clarity (Clear)                      Clear        Urine pH (5.0 - 8.0)                                     6.0        Ur Specific Gravity (1.005 - 1.025)                    1.018        Urine Protein (Negative mg/dL)             Trace        Urine Ketones (Negative mg/dL)                            15        Urine Blood (Negative)                     Trace        Urine Nitrite (Negative)                   Negative        Urine Bilirubin (Negative)                 Negative        Urine Urobilinogen (0.2 - 1.0 E.U./dL)  0.2        Ur Leukocyte Esterase (Negative)           Negative        Urine RBC (0 - 2 /HPF)                                   12.8        Urine WBC (0 - 5 /HPF)                                   1.7        Ur Epithelial Cells (0 - 2 /HPF)                         3.5        Urine Bacteria (None Seen)                                1+        Hyaline Casts (0 - 10 /LPF)                             0.37        Urinalysis Comment        Urine Glucose (Negative mg/dL)             Negative     Vital Signs        Date Time   Temp  Pulse  Resp  B/P     B/P   Pulse  O2        O2 Flow    FiO2                                               Mean  Ox     Delivery  Rate        06/12 1914  97.8     79    18  124/81    95    100  Room Air      0.0        External records reviewed Outpatient labs   studies  Departure Impression  Primary Impression: Horseshoe kidney  Secondary Impressions: Ureteral stone  Course Text  38 year old female with history and physical as above.  Right lower   quadrant/right flank  pain today.  History of stones.  Very uncomfortable appearing.  Differential   includes   stones, torsion, less likely appendicitis given exam     Plan  Labs, urine  IV fluids, Toradol , Dilaudid   CT abdomen and pelvis  pelvic US      Blood work is without acute abnormality.  Normal kidney function.  Urine has a   small   amount of blood but no significant pyuria.  Awaiting CT and ultrasound     Ultrasound demonstrates normal blood flow.  3.5 cm cyst in the left ovary, not   on the   side of the patient's pain.     CT scan demonstrates 4 mm stone at the right UVJ with mild hydronephrosis.     Patient's pain is improved  after above.  Tolerating clear liquids.    Well-appearing.  She  has previously passed stones and has been seen at the urology care center   prior.  I   recommended a trial of passage.  We'll discharge with oxycodone /acetaminophen ,   Zofran    and tamsulosin .  No indication for antibiotics at this time.  Patient will   return if   worsening severe symptoms, fever, pain, repeated vomiting, not making urine or   any other  concerns.   Otherwise, will follow up outpatient urology     The patient is aware of the discharge, return, and follow-up instructions and   agrees   with the plan. All questions were answered. Patient is discharged home in good     condition.          Boris Byars MD            Signature on File       Sign Date/Time:   01/27/24  0102                                                          Sign Date/Time:      Report Number: [id number]

## 2024-02-16 ENCOUNTER — Other Ambulatory Visit: Payer: Self-pay

## 2024-04-02 ENCOUNTER — Other Ambulatory Visit: Payer: Self-pay | Admitting: Adult Health

## 2024-04-02 LAB — CBC AND DIFFERENTIAL
Baso # K/uL: 0 X10 3/uL (ref 0.0–0.2)
Basophil %: 1 % (ref 0–2)
Eos # K/uL: 0.1 X10 3/uL (ref 0.0–0.7)
Eosinophil %: 2 % (ref 0–8)
Hematocrit: 41.4 % (ref 37.5–47.7)
Hemoglobin: 13.4 g/dL (ref 12.1–15.8)
Immature Granulocytes Absolute: 0.03 X10 3/uL (ref 0.0–0.031)
Immature Granulocytes: 0.4 % (ref 0.0–0.4)
Lymph # K/uL: 2.5 X10 3/uL (ref 1.0–5.2)
Lymphocyte %: 37 % (ref 13–42)
MCH: 29.9 pg (ref 26.4–33.6)
MCHC: 32.4 g/dL (ref 31.9–37.3)
MCV: 92 fL (ref 80–93)
Mean Platelet Volume: 9.6 fL — ABNORMAL LOW (ref 10.1–10.4)
Mono # K/uL: 0.6 X10 3/uL (ref 0.3–1.1)
Monocyte %: 8 % (ref 2–12)
Neut # K/uL: 3.5 X10 3/uL (ref 2.1–8.0)
Nucl RBC # K/uL: 0 x10 3/uL (ref 0.0–0.012)
Nucl RBC %: 0 % (ref 0.0–0.2)
Platelets: 263 10*3 (ref 144–366)
RBC Distribution Width-SD: 45.1 fL (ref 41.0–45.3)
RBC: 4.48 10*6 (ref 4.16–5.34)
RDW: 13.3 % (ref 12.8–14.2)
Seg Neut %: 52 % (ref 44–79)
WBC: 6.79 10*3 (ref 4.3–11.0)

## 2024-04-02 LAB — IRON
% Iron Saturation: 32 % (ref 9–55)
Iron: 112 ug/dL (ref 37–145)
TIBC: 348 ug/dL (ref 250–450)
UIBC: 236 ug/dL (ref 112–346)

## 2024-04-02 LAB — LIPID PANEL
Chol/HDL Ratio: 4.1
Cholesterol: 258 mg/dL — ABNORMAL HIGH (ref 107–200)
HDL: 63 mg/dL (ref 35–86)
LDL Calculated: 180 mg/dL — ABNORMAL HIGH (ref 0–100)
Triglycerides: 73 mg/dL (ref 35–150)

## 2024-04-02 LAB — COMPREHENSIVE METABOLIC PANEL
ALT: 11 U/L (ref 0–33)
AST: 12 U/L (ref 0–40)
Albumin: 4.4 g/dL (ref 3.5–5.2)
Alk Phos: 77 U/L (ref 35–104)
Anion Gap: 12
Bilirubin,Total: 0.4 mg/dL (ref 0.0–1.0)
CO2: 24 mmol/L (ref 22–29)
Calcium: 9.7 mg/dL (ref 8.6–10.4)
Chloride: 106 mmol/L (ref 98–107)
Creatinine: 0.9 mg/dL (ref 0.5–0.9)
Glucose: 87 mg/dL (ref 70–100)
Lab: 18 mg/dL (ref 6–20)
Potassium: 4.4 mmol/L (ref 3.5–5.1)
Sodium: 142 mmol/L (ref 136–145)
Total Protein: 7.2 g/dL (ref 6.6–8.7)
eGFR BY CREAT: 83

## 2024-04-02 LAB — TSH: TSH: 0.81 u[IU]/mL (ref 0.27–4.20)

## 2024-04-02 LAB — VITAMIN D: Vitamin D 25 Hydroxy: 29 ng/mL — ABNORMAL LOW (ref 30–100)

## 2024-04-02 LAB — VITAMIN B12: Vitamin B12: 480 pg/mL (ref 211–946)

## 2024-04-02 LAB — CK: CK: 65 U/L (ref 26–192)

## 2024-04-02 LAB — T4, FREE: Free T4: 1.13 ng/dL (ref 0.93–1.70)

## 2024-04-03 LAB — TRANSFERRIN: Transferrin: 281 mg/dL (ref 192–364)

## 2024-04-04 LAB — TB AG T-CELL STIMULATION: TB AG T-Cell Stim: NEGATIVE [IU]/mL
